# Patient Record
Sex: Female | Born: 1985 | Race: Black or African American | Hispanic: No | Smoking: Never smoker
Health system: Southern US, Community
[De-identification: ages and names within clinical notes are randomized; demographics above are authoritative.]

## PROBLEM LIST (undated history)

## (undated) DIAGNOSIS — J45909 Unspecified asthma, uncomplicated: Secondary | ICD-10-CM

## (undated) DIAGNOSIS — Z9109 Other allergy status, other than to drugs and biological substances: Secondary | ICD-10-CM

## (undated) DIAGNOSIS — O24419 Gestational diabetes mellitus in pregnancy, unspecified control: Secondary | ICD-10-CM

## (undated) DIAGNOSIS — F419 Anxiety disorder, unspecified: Secondary | ICD-10-CM

## (undated) DIAGNOSIS — F99 Mental disorder, not otherwise specified: Secondary | ICD-10-CM

## (undated) HISTORY — DX: Other allergy status, other than to drugs and biological substances: Z91.09

## (undated) HISTORY — PX: KNEE SURGERY: SHX244

## (undated) HISTORY — PX: TONSILLECTOMY: SUR1361

## (undated) HISTORY — PX: COLONOSCOPY: SHX5424

## (undated) HISTORY — DX: Anxiety disorder, unspecified: F41.9

## (undated) HISTORY — PX: COLON SURGERY: SHX602

## (undated) HISTORY — DX: Gestational diabetes mellitus in pregnancy, unspecified control: O24.419

## (undated) HISTORY — DX: Mental disorder, not otherwise specified: F99

---

## 2011-12-22 ENCOUNTER — Encounter (HOSPITAL_COMMUNITY): Payer: Self-pay | Admitting: Emergency Medicine

## 2011-12-22 ENCOUNTER — Emergency Department (HOSPITAL_COMMUNITY)
Admission: EM | Admit: 2011-12-22 | Discharge: 2011-12-22 | Disposition: A | Discharge status: Home / Self Care | Payer: Medicaid - Out of State | Attending: Emergency Medicine | Admitting: Emergency Medicine

## 2011-12-22 DIAGNOSIS — R102 Pelvic and perineal pain: Secondary | ICD-10-CM

## 2011-12-22 DIAGNOSIS — R319 Hematuria, unspecified: Secondary | ICD-10-CM | POA: Insufficient documentation

## 2011-12-22 DIAGNOSIS — N760 Acute vaginitis: Secondary | ICD-10-CM | POA: Insufficient documentation

## 2011-12-22 DIAGNOSIS — R109 Unspecified abdominal pain: Secondary | ICD-10-CM | POA: Insufficient documentation

## 2011-12-22 DIAGNOSIS — R300 Dysuria: Secondary | ICD-10-CM | POA: Insufficient documentation

## 2011-12-22 DIAGNOSIS — N390 Urinary tract infection, site not specified: Secondary | ICD-10-CM | POA: Insufficient documentation

## 2011-12-22 DIAGNOSIS — N898 Other specified noninflammatory disorders of vagina: Secondary | ICD-10-CM | POA: Insufficient documentation

## 2011-12-22 DIAGNOSIS — J45909 Unspecified asthma, uncomplicated: Secondary | ICD-10-CM | POA: Insufficient documentation

## 2011-12-22 DIAGNOSIS — A499 Bacterial infection, unspecified: Secondary | ICD-10-CM | POA: Insufficient documentation

## 2011-12-22 DIAGNOSIS — M545 Low back pain, unspecified: Secondary | ICD-10-CM | POA: Insufficient documentation

## 2011-12-22 DIAGNOSIS — B9689 Other specified bacterial agents as the cause of diseases classified elsewhere: Secondary | ICD-10-CM | POA: Insufficient documentation

## 2011-12-22 DIAGNOSIS — N949 Unspecified condition associated with female genital organs and menstrual cycle: Secondary | ICD-10-CM | POA: Insufficient documentation

## 2011-12-22 DIAGNOSIS — Z3202 Encounter for pregnancy test, result negative: Secondary | ICD-10-CM | POA: Insufficient documentation

## 2011-12-22 HISTORY — DX: Unspecified asthma, uncomplicated: J45.909

## 2011-12-22 LAB — URINE MICROSCOPIC-ADD ON

## 2011-12-22 LAB — URINALYSIS, ROUTINE W REFLEX MICROSCOPIC
Nitrite: POSITIVE — AB
Specific Gravity, Urine: 1.027 (ref 1.005–1.030)
Urobilinogen, UA: 4 mg/dL — ABNORMAL HIGH (ref 0.0–1.0)
pH: 5 (ref 5.0–8.0)

## 2011-12-22 LAB — POCT PREGNANCY, URINE: Preg Test, Ur: NEGATIVE

## 2011-12-22 LAB — WET PREP, GENITAL: Yeast Wet Prep HPF POC: NONE SEEN

## 2011-12-22 MED ORDER — HYDROCODONE-ACETAMINOPHEN 5-325 MG PO TABS: 1.0000 | ORAL_TABLET | ORAL | Status: DC | PRN

## 2011-12-22 MED ORDER — METRONIDAZOLE 500 MG PO TABS: 500.0000 mg | ORAL_TABLET | Freq: Two times a day (BID) | ORAL | Status: DC

## 2011-12-22 MED ORDER — CEFTRIAXONE SODIUM 250 MG IJ SOLR
250.0000 mg | Freq: Once | INTRAMUSCULAR | Status: AC
  Administered 2011-12-22: 250 mg via INTRAMUSCULAR
  Filled 2011-12-22: qty 250

## 2011-12-22 MED ORDER — ONDANSETRON HCL 4 MG PO TABS
4.0000 mg | ORAL_TABLET | Freq: Once | ORAL | Status: AC
  Administered 2011-12-22: 4 mg via ORAL
  Filled 2011-12-22: qty 1

## 2011-12-22 MED ORDER — OXYCODONE-ACETAMINOPHEN 5-325 MG PO TABS
1.0000 | ORAL_TABLET | Freq: Once | ORAL | Status: AC
  Administered 2011-12-22: 1 via ORAL
  Filled 2011-12-22: qty 1

## 2011-12-22 MED ORDER — PHENAZOPYRIDINE HCL 200 MG PO TABS: 200.0000 mg | ORAL_TABLET | Freq: Three times a day (TID) | ORAL | Status: DC

## 2011-12-22 MED ORDER — SULFAMETHOXAZOLE-TRIMETHOPRIM 800-160 MG PO TABS: 1.0000 | ORAL_TABLET | Freq: Two times a day (BID) | ORAL | Status: DC

## 2011-12-22 MED ORDER — AZITHROMYCIN 250 MG PO TABS
1000.0000 mg | ORAL_TABLET | Freq: Once | ORAL | Status: AC
  Administered 2011-12-22: 1000 mg via ORAL
  Filled 2011-12-22: qty 4

## 2011-12-22 NOTE — ED Provider Notes (Signed)
History     CSN: [id number]  Arrival date & time 12/22/11  0440   First MD Initiated Contact with Patient 12/22/11 [phone]      Chief Complaint  Patient presents with  . Urinary Tract Infection    (Consider location/radiation/quality/duration/timing/severity/associated sxs/prior treatment) Patient is a 26 y.o. female presenting with urinary tract infection. The history is provided by the patient.  Urinary Tract Infection This is a new problem. The current episode started in the past 7 days. The problem occurs constantly. Associated symptoms include abdominal pain. Pertinent negatives include no chills, fever, nausea, rash or vomiting. Associated symptoms comments: She complains of painful urination for the past 5 days, now associated with low abdominal pain, worse on the left, low back pain and vaginal discharge that is bloody. She reports a history of ovarian cyst on the left recently diagnosed in [place] prior to recent move to [facility].Marland Kitchen    Past Medical History  Diagnosis Date  . Asthma     History reviewed. No pertinent past surgical history.  No family history on file.  History  Substance Use Topics  . Smoking status: Never Smoker   . Smokeless tobacco: Not on file  . Alcohol Use: No    OB History    Grav Para Term Preterm Abortions TAB SAB Ect Mult Living                  Review of Systems  Constitutional: Negative for fever and chills.  Gastrointestinal: Positive for abdominal pain. Negative for nausea and vomiting.  Genitourinary: Positive for dysuria, hematuria, vaginal bleeding, vaginal discharge and pelvic pain.  Musculoskeletal: Positive for back pain.  Skin: Negative.  Negative for rash.  Neurological: Negative.     Allergies  Amoxicillin  Home Medications  No current outpatient prescriptions on file.  BP 137/87  Pulse 105  Temp 98.7 F (37.1 C) (Oral)  Resp 21  Wt 145 lb (65.772 kg)  SpO2 100%  LMP 12/18/2011  Physical Exam  Constitutional: She  is oriented to person, place, and time. She appears well-developed and well-nourished.  HENT:  Head: Normocephalic.  Neck: Normal range of motion. Neck supple.  Cardiovascular: Normal rate and regular rhythm.   Pulmonary/Chest: Effort normal and breath sounds normal.  Abdominal: Soft. Bowel sounds are normal. There is tenderness. There is no rebound and no guarding.       Tender to lower abdomen bilaterally. Soft, without mass.   Genitourinary: Vaginal discharge found.       Left adnexal tenderness without palpable mass. No CMT.  Musculoskeletal: Normal range of motion.  Neurological: She is alert and oriented to person, place, and time.  Skin: Skin is warm and dry. No rash noted.  Psychiatric: She has a normal mood and affect.    ED Course  Procedures (including critical care time)  Labs Reviewed  URINALYSIS, ROUTINE W REFLEX MICROSCOPIC - Abnormal; Notable for the following:    Color, Urine RED (*)  BIOCHEMICALS MAY BE AFFECTED BY COLOR   APPearance CLOUDY (*)     Hgb urine dipstick SMALL (*)     Bilirubin Urine SMALL (*)     Ketones, ur 15 (*)     Protein, ur 30 (*)     Urobilinogen, UA 4.0 (*)     Nitrite POSITIVE (*)     Leukocytes, UA MODERATE (*)     All other components within normal limits  URINE MICROSCOPIC-ADD ON - Abnormal; Notable for the following:    Bacteria,  UA MANY (*)     All other components within normal limits  POCT PREGNANCY, URINE   No results found.   No diagnosis found. 1. uti 2. Pelvic pain    MDM  Patient care transferred to [facility] with wet prep/GC pending. Abx given. Plan - tx for UTI (Bactrim, Pyridium, Norco) and GYN follow up for history of left ovarian cyst and persistent discomfort.         Rodena Medin, PA-C 12/22/11 [phone]

## 2011-12-22 NOTE — ED Notes (Signed)
Pt alert, arrives from home, c/o lower abd pain, bloody discharge, resp even unlabored, skin pwd, c/o nausea

## 2011-12-22 NOTE — ED Provider Notes (Signed)
Received signout on this patient from Langley Adie, PA-C.  Plan is to discharge the patient pending wet mount results.  Wet mount shows BV.  Will give metronidazole and discharge the patient.  Roxy Horseman, PA-C 12/22/11 [phone]

## 2011-12-22 NOTE — ED Provider Notes (Signed)
Medical screening examination/treatment/procedure(s) were performed by non-physician practitioner and as supervising physician I was immediately available for consultation/collaboration.  Catrena Vari T Cherree Conerly, MD 12/22/11 0719 

## 2011-12-22 NOTE — ED Provider Notes (Signed)
  Medical screening examination/treatment/procedure(s) were performed by non-physician practitioner and as supervising physician I was immediately available for consultation/collaboration.    Vida Roller, MD 12/22/11 [phone]

## 2011-12-22 NOTE — ED Notes (Signed)
[id number]> Expected date:<BR> Expected time:<BR> Means of arrival:<BR> Comments:<BR> Because topher is tired

## 2011-12-22 NOTE — ED Notes (Signed)
Pelvic cart at pt bedside 

## 2011-12-24 LAB — GC/CHLAMYDIA PROBE AMP
CT Probe RNA: NEGATIVE
GC Probe RNA: POSITIVE — AB

## 2011-12-25 NOTE — ED Notes (Signed)
+  Gonorrhea Patient treated with rocephin and zithromax- [facility]

## 2014-02-14 ENCOUNTER — Encounter (HOSPITAL_COMMUNITY): Payer: Self-pay | Admitting: Emergency Medicine

## 2014-02-14 ENCOUNTER — Emergency Department (HOSPITAL_COMMUNITY)
Admission: EM | Admit: 2014-02-14 | Discharge: 2014-02-14 | Disposition: A | Discharge status: Home / Self Care | Payer: Medicaid - Out of State | Attending: Emergency Medicine | Admitting: Emergency Medicine

## 2014-02-14 DIAGNOSIS — J069 Acute upper respiratory infection, unspecified: Secondary | ICD-10-CM

## 2014-02-14 DIAGNOSIS — Z3202 Encounter for pregnancy test, result negative: Secondary | ICD-10-CM | POA: Insufficient documentation

## 2014-02-14 DIAGNOSIS — N760 Acute vaginitis: Secondary | ICD-10-CM | POA: Insufficient documentation

## 2014-02-14 DIAGNOSIS — Z880 Allergy status to penicillin: Secondary | ICD-10-CM | POA: Insufficient documentation

## 2014-02-14 DIAGNOSIS — J45909 Unspecified asthma, uncomplicated: Secondary | ICD-10-CM | POA: Insufficient documentation

## 2014-02-14 DIAGNOSIS — B9689 Other specified bacterial agents as the cause of diseases classified elsewhere: Secondary | ICD-10-CM

## 2014-02-14 DIAGNOSIS — Z79899 Other long term (current) drug therapy: Secondary | ICD-10-CM | POA: Insufficient documentation

## 2014-02-14 LAB — WET PREP, GENITAL
Trich, Wet Prep: NONE SEEN
WBC WET PREP: NONE SEEN
YEAST WET PREP: NONE SEEN

## 2014-02-14 LAB — CBC WITH DIFFERENTIAL/PLATELET
Basophils Absolute: 0 10*3/uL (ref 0.0–0.1)
Basophils Relative: 0 % (ref 0–1)
EOS ABS: 0.1 10*3/uL (ref 0.0–0.7)
EOS PCT: 2 % (ref 0–5)
HCT: 42 % (ref 36.0–46.0)
HEMOGLOBIN: 14.3 g/dL (ref 12.0–15.0)
LYMPHS ABS: 2.5 10*3/uL (ref 0.7–4.0)
Lymphocytes Relative: 39 % (ref 12–46)
MCH: 32.6 pg (ref 26.0–34.0)
MCHC: 34 g/dL (ref 30.0–36.0)
MCV: 95.7 fL (ref 78.0–100.0)
Monocytes Absolute: 0.5 10*3/uL (ref 0.1–1.0)
Monocytes Relative: 8 % (ref 3–12)
Neutro Abs: 3.2 10*3/uL (ref 1.7–7.7)
Neutrophils Relative %: 51 % (ref 43–77)
PLATELETS: 237 10*3/uL (ref 150–400)
RBC: 4.39 MIL/uL (ref 3.87–5.11)
RDW: 13.2 % (ref 11.5–15.5)
WBC: 6.3 10*3/uL (ref 4.0–10.5)

## 2014-02-14 LAB — URINALYSIS, ROUTINE W REFLEX MICROSCOPIC
BILIRUBIN URINE: NEGATIVE
GLUCOSE, UA: NEGATIVE mg/dL
HGB URINE DIPSTICK: NEGATIVE
KETONES UR: NEGATIVE mg/dL
Leukocytes, UA: NEGATIVE
NITRITE: NEGATIVE
PROTEIN: NEGATIVE mg/dL
Specific Gravity, Urine: 1.03 (ref 1.005–1.030)
UROBILINOGEN UA: 1 mg/dL (ref 0.0–1.0)
pH: 6 (ref 5.0–8.0)

## 2014-02-14 LAB — BASIC METABOLIC PANEL
ANION GAP: 9 (ref 5–15)
BUN: 16 mg/dL (ref 6–23)
CO2: 22 mmol/L (ref 19–32)
CREATININE: 0.88 mg/dL (ref 0.50–1.10)
Calcium: 9 mg/dL (ref 8.4–10.5)
Chloride: 108 mmol/L (ref 96–112)
GFR, EST NON AFRICAN AMERICAN: 90 mL/min — AB (ref >90)
Glucose, Bld: 94 mg/dL (ref 70–99)
POTASSIUM: 4.3 mmol/L (ref 3.5–5.1)
Sodium: 139 mmol/L (ref 135–145)

## 2014-02-14 LAB — POC URINE PREG, ED: PREG TEST UR: NEGATIVE

## 2014-02-14 MED ORDER — GUAIFENESIN-DM 100-10 MG/5ML PO SYRP: 5.0000 mL | ORAL_SOLUTION | ORAL | Status: DC | PRN

## 2014-02-14 MED ORDER — METRONIDAZOLE 500 MG PO TABS: 500.0000 mg | ORAL_TABLET | Freq: Two times a day (BID) | ORAL | Status: DC

## 2014-02-14 MED ORDER — BENZONATATE 100 MG PO CAPS: 100.0000 mg | ORAL_CAPSULE | Freq: Three times a day (TID) | ORAL | Status: DC

## 2014-02-14 NOTE — ED Notes (Signed)
Per pt, states cold symptoms for a couple of days-vaginal discharge/UTI symptoms for "awhile"

## 2014-02-14 NOTE — Discharge Instructions (Signed)
DO not drink alcohol with Flagyl.    Bacterial Vaginosis Bacterial vaginosis is a vaginal infection that occurs when the normal balance of bacteria in the vagina is disrupted. It results from an overgrowth of certain bacteria. This is the most common vaginal infection in women of childbearing age. Treatment is important to prevent complications, especially in pregnant women, as it can cause a premature delivery. CAUSES  Bacterial vaginosis is caused by an increase in harmful bacteria that are normally present in smaller amounts in the vagina. Several different kinds of bacteria can cause bacterial vaginosis. However, the reason that the condition develops is not fully understood. RISK FACTORS Certain activities or behaviors can put you at an increased risk of developing bacterial vaginosis, including:  Having a new sex partner or multiple sex partners.  Douching.  Using an intrauterine device (IUD) for contraception. Women do not get bacterial vaginosis from toilet seats, bedding, swimming pools, or contact with objects around them. SIGNS AND SYMPTOMS  Some women with bacterial vaginosis have no signs or symptoms. Common symptoms include:  Grey vaginal discharge.  A fishlike odor with discharge, especially after sexual intercourse.  Itching or burning of the vagina and vulva.  Burning or pain with urination. DIAGNOSIS  Your health care provider will take a medical history and examine the vagina for signs of bacterial vaginosis. A sample of vaginal fluid may be taken. Your health care provider will look at this sample under a microscope to check for bacteria and abnormal cells. A vaginal pH test may also be done.  TREATMENT  Bacterial vaginosis may be treated with antibiotic medicines. These may be given in the form of a pill or a vaginal cream. A second round of antibiotics may be prescribed if the condition comes back after treatment.  HOME CARE INSTRUCTIONS   Only take  over-the-counter or prescription medicines as directed by your health care provider.  If antibiotic medicine was prescribed, take it as directed. Make sure you finish it even if you start to feel better.  Do not have sex until treatment is completed.  Tell all sexual partners that you have a vaginal infection. They should see their health care provider and be treated if they have problems, such as a mild rash or itching.  Practice safe sex by using condoms and only having one sex partner. SEEK MEDICAL CARE IF:   Your symptoms are not improving after 3 days of treatment.  You have increased discharge or pain.  You have a fever. MAKE SURE YOU:   Understand these instructions.  Will watch your condition.  Will get help right away if you are not doing well or get worse. FOR MORE INFORMATION  Centers for Disease Control and Prevention, Division of STD Prevention: SolutionApps.co.za American Sexual Health Association (ASHA): www.ashastd.org  Document Released: 01/01/2005 Document Revised: 10/22/2012 Document Reviewed: 08/13/2012 [facility] Patient Information 2015 [place], [place]. This information is not intended to replace advice given to you by your health care provider. Make sure you discuss any questions you have with your health care provider.  Upper Respiratory Infection, Adult An upper respiratory infection (URI) is also sometimes known as the common cold. The upper respiratory tract includes the nose, sinuses, throat, trachea, and bronchi. Bronchi are the airways leading to the lungs. Most people improve within 1 week, but symptoms can last up to 2 weeks. A residual cough may last even longer.  CAUSES Many different viruses can infect the tissues lining the upper respiratory tract. The tissues become  irritated and inflamed and often become very moist. Mucus production is also common. A cold is contagious. You can easily spread the virus to others by oral contact. This includes kissing,  sharing a glass, coughing, or sneezing. Touching your mouth or nose and then touching a surface, which is then touched by another person, can also spread the virus. SYMPTOMS  Symptoms typically develop 1 to 3 days after you come in contact with a cold virus. Symptoms vary from person to person. They may include:  Runny nose.  Sneezing.  Nasal congestion.  Sinus irritation.  Sore throat.  Loss of voice (laryngitis).  Cough.  Fatigue.  Muscle aches.  Loss of appetite.  Headache.  Low-grade fever. DIAGNOSIS  You might diagnose your own cold based on familiar symptoms, since most people get a cold 2 to 3 times a year. Your caregiver can confirm this based on your exam. Most importantly, your caregiver can check that your symptoms are not due to another disease such as strep throat, sinusitis, pneumonia, asthma, or epiglottitis. Blood tests, throat tests, and X-rays are not necessary to diagnose a common cold, but they may sometimes be helpful in excluding other more serious diseases. Your caregiver will decide if any further tests are required. RISKS AND COMPLICATIONS  You may be at risk for a more severe case of the common cold if you smoke cigarettes, have chronic heart disease (such as heart failure) or lung disease (such as asthma), or if you have a weakened immune system. The very young and very old are also at risk for more serious infections. Bacterial sinusitis, middle ear infections, and bacterial pneumonia can complicate the common cold. The common cold can worsen asthma and chronic obstructive pulmonary disease (COPD). Sometimes, these complications can require emergency medical care and may be life-threatening. PREVENTION  The best way to protect against getting a cold is to practice good hygiene. Avoid oral or hand contact with people with cold symptoms. Wash your hands often if contact occurs. There is no clear evidence that vitamin C, vitamin E, echinacea, or exercise  reduces the chance of developing a cold. However, it is always recommended to get plenty of rest and practice good nutrition. TREATMENT  Treatment is directed at relieving symptoms. There is no cure. Antibiotics are not effective, because the infection is caused by a virus, not by bacteria. Treatment may include:  Increased fluid intake. Sports drinks offer valuable electrolytes, sugars, and fluids.  Breathing heated mist or steam (vaporizer or shower).  Eating chicken soup or other clear broths, and maintaining good nutrition.  Getting plenty of rest.  Using gargles or lozenges for comfort.  Controlling fevers with ibuprofen or acetaminophen as directed by your caregiver.  Increasing usage of your inhaler if you have asthma. Zinc gel and zinc lozenges, taken in the first 24 hours of the common cold, can shorten the duration and lessen the severity of symptoms. Pain medicines may help with fever, muscle aches, and throat pain. A variety of non-prescription medicines are available to treat congestion and runny nose. Your caregiver can make recommendations and may suggest nasal or lung inhalers for other symptoms.  HOME CARE INSTRUCTIONS   Only take over-the-counter or prescription medicines for pain, discomfort, or fever as directed by your caregiver.  Use a warm mist humidifier or inhale steam from a shower to increase air moisture. This may keep secretions moist and make it easier to breathe.  Drink enough water and fluids to keep your urine clear or  pale yellow.  Rest as needed.  Return to work when your temperature has returned to normal or as your caregiver advises. You may need to stay home longer to avoid infecting others. You can also use a face mask and careful hand washing to prevent spread of the virus. SEEK MEDICAL CARE IF:   After the first few days, you feel you are getting worse rather than better.  You need your caregiver's advice about medicines to control  symptoms.  You develop chills, worsening shortness of breath, or brown or red sputum. These may be signs of pneumonia.  You develop yellow or brown nasal discharge or pain in the face, especially when you bend forward. These may be signs of sinusitis.  You develop a fever, swollen neck glands, pain with swallowing, or white areas in the back of your throat. These may be signs of strep throat. SEEK IMMEDIATE MEDICAL CARE IF:   You have a fever.  You develop severe or persistent headache, ear pain, sinus pain, or chest pain.  You develop wheezing, a prolonged cough, cough up blood, or have a change in your usual mucus (if you have chronic lung disease).  You develop sore muscles or a stiff neck. Document Released: 06/27/2000 Document Revised: 03/26/2011 Document Reviewed: 04/08/2013 [facility] Patient Information 2015 [place], [place]. This information is not intended to replace advice given to you by your health care provider. Make sure you discuss any questions you have with your health care provider.

## 2014-02-14 NOTE — ED Provider Notes (Signed)
CSN: [id number]     Arrival date & time 02/14/14  0802 History   First MD Initiated Contact with Patient 02/14/14 [phone]     Chief Complaint  Patient presents with  . URI  . Vaginal Discharge     (Consider location/radiation/quality/duration/timing/severity/associated sxs/prior Treatment) HPI Comments: 29 y/o F with a PMH of asthma presenting to the ED for evaluation of URI symptoms and vaginal discharge. She has had a non-productive cough and runny nose for one week, her son who is here today also being evaluated has had similar symptoms that began around the same time. She denies fever, chills, SOB, nausea, vomiting, diarrhea. She has also had a white vaginal discharge with mild back and pelvic pain for several weeks. She denies vaginal bleeding, hematuria, urinary frequency, dysuria.   Patient is a 29 y.o. female presenting with URI and vaginal discharge. The history is provided by the patient.  URI Vaginal Discharge   Past Medical History  Diagnosis Date  . Asthma    History reviewed. No pertinent past surgical history. No family history on file. History  Substance Use Topics  . Smoking status: Never Smoker   . Smokeless tobacco: Not on file  . Alcohol Use: No   OB History    No data available     Review of Systems  Genitourinary: Positive for vaginal discharge.      Allergies  Amoxicillin  Home Medications   Prior to Admission medications   Medication Sig Start Date End Date Taking? Authorizing Provider  benzonatate (TESSALON) 100 MG capsule Take 1 capsule (100 mg total) by mouth every 8 (eight) hours. 02/14/14   Ermie Glendenning, PA-C  guaiFENesin-dextromethorphan (ROBITUSSIN DM) 100-10 MG/5ML syrup Take 5 mLs by mouth every 4 (four) hours as needed for cough. 02/14/14   Arthor Captain, PA-C  HYDROcodone-acetaminophen (NORCO/VICODIN) 5-325 MG per tablet Take 1 tablet by mouth every 4 (four) hours as needed for pain. Patient not taking: Reported on 02/14/2014 12/22/11    Melvenia Beam A Upstill, PA-C  metroNIDAZOLE (FLAGYL) 500 MG tablet Take 1 tablet (500 mg total) by mouth 2 (two) times daily. One po bid x 7 days 02/14/14   Arthor Captain, PA-C  phenazopyridine (PYRIDIUM) 200 MG tablet Take 1 tablet (200 mg total) by mouth 3 (three) times daily. Patient not taking: Reported on 02/14/2014 12/22/11   Melvenia Beam A Upstill, PA-C  sulfamethoxazole-trimethoprim (SEPTRA DS) 800-160 MG per tablet Take 1 tablet by mouth every 12 (twelve) hours. Patient not taking: Reported on 02/14/2014 12/22/11   Melvenia Beam A Upstill, PA-C   BP 112/73 mmHg  Pulse 73  Temp(Src) 98.2 F (36.8 C) (Oral)  Resp 16  SpO2 100%  LMP 01/31/2014 Physical Exam  Constitutional: She is oriented to person, place, and time. She appears well-developed and well-nourished. No distress.  HENT:  Head: Normocephalic and atraumatic.  Eyes: Conjunctivae are normal. No scleral icterus.  Neck: Normal range of motion.  Cardiovascular: Normal rate, regular rhythm and normal heart sounds.  Exam reveals no gallop and no friction rub.   No murmur heard. Pulmonary/Chest: Effort normal and breath sounds normal. No respiratory distress. She has no wheezes.  Ronchi, clear with cough  Abdominal: Soft. Bowel sounds are normal. She exhibits no distension and no mass. There is no tenderness. There is no guarding.  Genitourinary:  Pelvic exam: normal external genitalia, vulva, vagina, cervix, uterus and adnexa. White creamy vaginal discharge noted. No CMT, Adnexal tenderness or fullness  Neurological: She is alert and oriented to person, place,  and time.  Skin: Skin is warm and dry. She is not diaphoretic.    ED Course  Procedures (including critical care time) Labs Review Labs Reviewed  WET PREP, GENITAL - Abnormal; Notable for the following:    Clue Cells Wet Prep HPF POC FEW (*)    All other components within normal limits  BASIC METABOLIC PANEL - Abnormal; Notable for the following:    GFR calc non Af Amer 90 (*)    All  other components within normal limits  CBC WITH DIFFERENTIAL/PLATELET  URINALYSIS, ROUTINE W REFLEX MICROSCOPIC  HIV ANTIBODY (ROUTINE TESTING)  RPR  POC URINE PREG, ED  GC/CHLAMYDIA PROBE AMP ([place])    Imaging Review No results found.   EKG Interpretation None      MDM   Final diagnoses:  URI (upper respiratory infection)  BV (bacterial vaginosis)    Patient with URI, bacterial vaginosis. Patient will be discharged with symptomatic treatment and Flagyl. She appears safe for discharge. Follow up with PCP.  Arthor CaptainAbigail Forney Kleinpeter, PA-C 02/14/14 1657  Benny LennertJoseph L Zammit, MD 03/02/14 [id number]

## 2014-02-15 LAB — GC/CHLAMYDIA PROBE AMP (~~LOC~~) NOT AT ARMC
Chlamydia: NEGATIVE
NEISSERIA GONORRHEA: NEGATIVE

## 2014-02-16 LAB — RPR: RPR: NONREACTIVE

## 2014-02-16 LAB — HIV ANTIBODY (ROUTINE TESTING W REFLEX): HIV Screen 4th Generation wRfx: NONREACTIVE

## 2014-05-28 ENCOUNTER — Encounter (HOSPITAL_COMMUNITY): Payer: Self-pay | Admitting: Emergency Medicine

## 2014-05-28 ENCOUNTER — Emergency Department (HOSPITAL_COMMUNITY)
Admission: EM | Admit: 2014-05-28 | Discharge: 2014-05-28 | Disposition: A | Discharge status: Home / Self Care | Payer: Medicaid - Out of State | Attending: Emergency Medicine | Admitting: Emergency Medicine

## 2014-05-28 DIAGNOSIS — Z880 Allergy status to penicillin: Secondary | ICD-10-CM | POA: Insufficient documentation

## 2014-05-28 DIAGNOSIS — Y998 Other external cause status: Secondary | ICD-10-CM | POA: Insufficient documentation

## 2014-05-28 DIAGNOSIS — Y92410 Unspecified street and highway as the place of occurrence of the external cause: Secondary | ICD-10-CM | POA: Insufficient documentation

## 2014-05-28 DIAGNOSIS — Y9389 Activity, other specified: Secondary | ICD-10-CM | POA: Insufficient documentation

## 2014-05-28 DIAGNOSIS — J45909 Unspecified asthma, uncomplicated: Secondary | ICD-10-CM | POA: Insufficient documentation

## 2014-05-28 DIAGNOSIS — IMO0001 Reserved for inherently not codable concepts without codable children: Secondary | ICD-10-CM

## 2014-05-28 DIAGNOSIS — S161XXA Strain of muscle, fascia and tendon at neck level, initial encounter: Secondary | ICD-10-CM | POA: Insufficient documentation

## 2014-05-28 MED ORDER — IBUPROFEN 800 MG PO TABS: 800.0000 mg | ORAL_TABLET | Freq: Three times a day (TID) | ORAL | Status: DC

## 2014-05-28 MED ORDER — IBUPROFEN 800 MG PO TABS
800.0000 mg | ORAL_TABLET | Freq: Once | ORAL | Status: AC
  Administered 2014-05-28: 800 mg via ORAL
  Filled 2014-05-28: qty 1

## 2014-05-28 MED ORDER — CYCLOBENZAPRINE HCL 5 MG PO TABS: 5.0000 mg | ORAL_TABLET | Freq: Three times a day (TID) | ORAL | Status: DC | PRN
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
## 2014-05-30 ENCOUNTER — Emergency Department (HOSPITAL_COMMUNITY): Payer: Medicaid - Out of State

## 2014-05-30 ENCOUNTER — Emergency Department (HOSPITAL_COMMUNITY)
Admission: EM | Admit: 2014-05-30 | Discharge: 2014-05-30 | Disposition: A | Discharge status: Home / Self Care | Payer: Medicaid - Out of State | Attending: Emergency Medicine | Admitting: Emergency Medicine

## 2014-05-30 ENCOUNTER — Encounter (HOSPITAL_COMMUNITY): Payer: Self-pay | Admitting: Emergency Medicine

## 2014-05-30 DIAGNOSIS — Z791 Long term (current) use of non-steroidal anti-inflammatories (NSAID): Secondary | ICD-10-CM | POA: Insufficient documentation

## 2014-05-30 DIAGNOSIS — S24109D Unspecified injury at unspecified level of thoracic spinal cord, subsequent encounter: Secondary | ICD-10-CM | POA: Insufficient documentation

## 2014-05-30 DIAGNOSIS — J45909 Unspecified asthma, uncomplicated: Secondary | ICD-10-CM | POA: Insufficient documentation

## 2014-05-30 DIAGNOSIS — Z3202 Encounter for pregnancy test, result negative: Secondary | ICD-10-CM | POA: Insufficient documentation

## 2014-05-30 DIAGNOSIS — M546 Pain in thoracic spine: Secondary | ICD-10-CM

## 2014-05-30 DIAGNOSIS — Z79899 Other long term (current) drug therapy: Secondary | ICD-10-CM | POA: Insufficient documentation

## 2014-05-30 DIAGNOSIS — Z880 Allergy status to penicillin: Secondary | ICD-10-CM | POA: Insufficient documentation

## 2014-05-30 DIAGNOSIS — M7989 Other specified soft tissue disorders: Secondary | ICD-10-CM | POA: Insufficient documentation

## 2014-05-30 LAB — POC URINE PREG, ED: Preg Test, Ur: NEGATIVE
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
## 2015-12-12 ENCOUNTER — Emergency Department (HOSPITAL_COMMUNITY)
Admission: EM | Admit: 2015-12-12 | Discharge: 2015-12-12 | Disposition: A | Discharge status: Home / Self Care | Payer: Medicaid - Out of State | Attending: Emergency Medicine | Admitting: Emergency Medicine

## 2015-12-12 ENCOUNTER — Encounter (HOSPITAL_COMMUNITY): Payer: Self-pay | Admitting: Emergency Medicine

## 2015-12-12 DIAGNOSIS — L509 Urticaria, unspecified: Secondary | ICD-10-CM | POA: Insufficient documentation

## 2015-12-12 DIAGNOSIS — Z79899 Other long term (current) drug therapy: Secondary | ICD-10-CM | POA: Insufficient documentation

## 2015-12-12 DIAGNOSIS — J45909 Unspecified asthma, uncomplicated: Secondary | ICD-10-CM | POA: Insufficient documentation

## 2015-12-12 MED ORDER — PREDNISONE 20 MG PO TABS: 40.0000 mg | ORAL_TABLET | Freq: Every day | ORAL | 0 refills | Status: DC

## 2015-12-12 MED ORDER — PREDNISONE 20 MG PO TABS
60.0000 mg | ORAL_TABLET | Freq: Once | ORAL | Status: AC
  Administered 2015-12-12: 60 mg via ORAL
  Filled 2015-12-12: qty 3

## 2015-12-12 NOTE — ED Provider Notes (Signed)
[facility]-EMERGENCY DEPT Provider Note   CSN: [id number] Arrival date & time: 12/12/15  0005     History   Chief Complaint Chief Complaint  Patient presents with  . Rash    HPI Caitlin Terrell is a 30 y.o. female.  Patient presents emergency department with chief complaint of hives. She states that she has had a rash on her arms and legs and on her trunk which has been gradually worsening over the past day or so. She denies any new contacts or exposures to any new soaps or detergents. Denies eating or drinking anything new. She is uncertain what has set her off. She does have a history of asthma. She denies any associated shortness of breath, throat swelling, nausea, or vomiting, or diarrhea. She is tried taking Benadryl with some relief.   The history is provided by the patient. No language interpreter was used.    Past Medical History:  Diagnosis Date  . Asthma     There are no active problems to display for this patient.   Past Surgical History:  Procedure Laterality Date  . COLONOSCOPY      OB History    No data available       Home Medications    Prior to Admission medications   Medication Sig Start Date End Date Taking? Authorizing Provider  benzonatate (TESSALON) 100 MG capsule Take 1 capsule (100 mg total) by mouth every 8 (eight) hours. Patient not taking: Reported on 05/30/2014 02/14/14   Arthor CaptainAbigail Harris, PA-C  cyclobenzaprine (FLEXERIL) 5 MG tablet Take 1 tablet (5 mg total) by mouth 3 (three) times daily as needed for muscle spasms. 05/28/14   Teressa LowerVrinda Pickering, NP  guaiFENesin-dextromethorphan (ROBITUSSIN DM) 100-10 MG/5ML syrup Take 5 mLs by mouth every 4 (four) hours as needed for cough. Patient not taking: Reported on 05/30/2014 02/14/14   Arthor CaptainAbigail Harris, PA-C  HYDROcodone-acetaminophen (NORCO/VICODIN) 5-325 MG per tablet Take 1 tablet by mouth every 4 (four) hours as needed for pain. Patient not taking: Reported on 02/14/2014 12/22/11   Elpidio AnisShari Upstill, PA-C    ibuprofen (ADVIL,MOTRIN) 800 MG tablet Take 1 tablet (800 mg total) by mouth 3 (three) times daily. 05/28/14   Teressa LowerVrinda Pickering, NP  metroNIDAZOLE (FLAGYL) 500 MG tablet Take 1 tablet (500 mg total) by mouth 2 (two) times daily. One po bid x 7 days Patient not taking: Reported on 05/30/2014 02/14/14   Arthor CaptainAbigail Harris, PA-C  phenazopyridine (PYRIDIUM) 200 MG tablet Take 1 tablet (200 mg total) by mouth 3 (three) times daily. Patient not taking: Reported on 02/14/2014 12/22/11   Elpidio AnisShari Upstill, PA-C  predniSONE (DELTASONE) 20 MG tablet Take 2 tablets (40 mg total) by mouth daily. 12/12/15   Roxy Horsemanobert Londa Mackowski, PA-C  sulfamethoxazole-trimethoprim (SEPTRA DS) 800-160 MG per tablet Take 1 tablet by mouth every 12 (twelve) hours. Patient not taking: Reported on 02/14/2014 12/22/11   Elpidio AnisShari Upstill, PA-C    Family History No family history on file.  Social History Social History  Substance Use Topics  . Smoking status: Never Smoker  . Smokeless tobacco: Not on file  . Alcohol use No     Allergies   Amoxicillin and Other   Review of Systems Review of Systems  All other systems reviewed and are negative.    Physical Exam Updated Vital Signs BP 127/91 (BP Location: Right Arm)   Pulse 86   Temp 98.7 F (37.1 C) (Oral)   Resp 15   Ht 5\' 2"  (1.575 m)   Wt 74.8 kg  SpO2 100%   BMI 30.18 kg/m   Physical Exam  Constitutional: She is oriented to person, place, and time. She appears well-developed and well-nourished.  HENT:  Head: Normocephalic and atraumatic.  No throat swelling, no stridor, normal phonation  Eyes: Conjunctivae and EOM are normal. Pupils are equal, round, and reactive to light.  Neck: Normal range of motion. Neck supple.  Cardiovascular: Normal rate and regular rhythm.  Exam reveals no gallop and no friction rub.   No murmur heard. Pulmonary/Chest: Effort normal and breath sounds normal. No respiratory distress. She has no wheezes. She has no rales. She exhibits no  tenderness.  Clear to auscultation bilaterally  Abdominal: Soft. Bowel sounds are normal. She exhibits no distension and no mass. There is no tenderness. There is no rebound and no guarding.  Musculoskeletal: Normal range of motion. She exhibits no edema or tenderness.  Neurological: She is alert and oriented to person, place, and time.  Skin: Skin is warm and dry.  Scattered hives on upper and lower extremities and trunk  Psychiatric: She has a normal mood and affect. Her behavior is normal. Judgment and thought content normal.  Nursing note and vitals reviewed.    ED Treatments / Results  Labs (all labs ordered are listed, but only abnormal results are displayed) Labs Reviewed - No data to display  EKG  EKG Interpretation None       Radiology No results found.  Procedures Procedures (including critical care time)  Medications Ordered in ED Medications  predniSONE (DELTASONE) tablet 60 mg (not administered)     Initial Impression / Assessment and Plan / ED Course  I have reviewed the triage vital signs and the nursing notes.  Pertinent labs & imaging results that were available during my care of the patient were reviewed by me and considered in my medical decision making (see chart for details).  Clinical Course     Patient with hives with unknown exposure. No improvement at home. Will give prednisone. Recommend care follow-up. Final Clinical Impressions(s) / ED Diagnoses   Final diagnoses:  Urticaria    New Prescriptions New Prescriptions   PREDNISONE (DELTASONE) 20 MG TABLET    Take 2 tablets (40 mg total) by mouth daily.     Roxy Horsemanobert Leanord Thibeau, PA-C 12/12/15 0047    Tilden FossaElizabeth Rees, MD 12/12/15 [phone]

## 2015-12-12 NOTE — ED Triage Notes (Signed)
Pt from home with complaints of rash on abdomen and neck that began tonight. Pt has taken PO benadryl and used a benadryl cream around 2200

## 2016-10-19 ENCOUNTER — Emergency Department (HOSPITAL_COMMUNITY)
Admission: EM | Admit: 2016-10-19 | Discharge: 2016-10-19 | Disposition: A | Discharge status: Home / Self Care | Payer: Medicaid - Out of State | Attending: Emergency Medicine | Admitting: Emergency Medicine

## 2016-10-19 ENCOUNTER — Encounter (HOSPITAL_COMMUNITY): Payer: Self-pay | Admitting: *Deleted

## 2016-10-19 DIAGNOSIS — B360 Pityriasis versicolor: Secondary | ICD-10-CM

## 2016-10-19 DIAGNOSIS — Z791 Long term (current) use of non-steroidal anti-inflammatories (NSAID): Secondary | ICD-10-CM | POA: Insufficient documentation

## 2016-10-19 DIAGNOSIS — J45909 Unspecified asthma, uncomplicated: Secondary | ICD-10-CM | POA: Insufficient documentation

## 2016-10-19 MED ORDER — ITRACONAZOLE 100 MG PO CAPS: 200.0000 mg | ORAL_CAPSULE | Freq: Every day | ORAL | 0 refills | Status: DC

## 2016-10-19 NOTE — ED Provider Notes (Signed)
[facility]-EMERGENCY DEPT Provider Note   CSN: [id number] Arrival date & time: 10/19/16  0920     History   Chief Complaint Chief Complaint  Patient presents with  . Rash    HPI Caitlin Terrell is a 31 y.o. female.  31 year old female presents with recurrent facial rash times several months. Notes slight pruritus but denies any associated fever. Rash has slightly raised border and has been nonvesicular. Has not been noted on the rest of her body. Has used over-the-counter antifungals without relief.      Past Medical History:  Diagnosis Date  . Asthma     There are no active problems to display for this patient.   Past Surgical History:  Procedure Laterality Date  . COLONOSCOPY      OB History    No data available       Home Medications    Prior to Admission medications   Medication Sig Start Date End Date Taking? Authorizing Provider  naproxen sodium (ANAPROX) 220 MG tablet Take 660 mg by mouth 2 (two) times daily with a meal.   Yes [provider]  itraconazole (SPORANOX) 100 MG capsule Take 2 capsules (200 mg total) by mouth daily. 2 by mouth daily 7 days 10/19/16   Lorre Nick, MD    Family History No family history on file.  Social History Social History  Substance Use Topics  . Smoking status: Never Smoker  . Smokeless tobacco: Never Used  . Alcohol use No     Allergies   Amoxicillin and Other   Review of Systems Review of Systems  All other systems reviewed and are negative.    Physical Exam Updated Vital Signs BP (!) 124/94 (BP Location: Left Arm)   Pulse 97   Temp 98.3 F (36.8 C) (Oral)   Resp 18   LMP 10/19/2016   SpO2 100%   Physical Exam  Constitutional: She is oriented to person, place, and time. She appears well-developed and well-nourished.  Non-toxic appearance. No distress.  HENT:  Head: Normocephalic and atraumatic.    Eyes: Pupils are equal, round, and reactive to light. Conjunctivae, EOM and lids are  normal.  Neck: Normal range of motion. Neck supple. No tracheal deviation present. No thyroid mass present.  Cardiovascular: Normal rate, regular rhythm and normal heart sounds.  Exam reveals no gallop.   No murmur heard. Pulmonary/Chest: Effort normal and breath sounds normal. No stridor. No respiratory distress. She has no decreased breath sounds. She has no wheezes. She has no rhonchi. She has no rales.  Abdominal: Soft. Normal appearance and bowel sounds are normal. She exhibits no distension. There is no tenderness. There is no rebound and no CVA tenderness.  Musculoskeletal: Normal range of motion. She exhibits no edema or tenderness.  Neurological: She is alert and oriented to person, place, and time. She has normal strength. No cranial nerve deficit or sensory deficit. GCS eye subscore is 4. GCS verbal subscore is 5. GCS motor subscore is 6.  Skin: Skin is warm and dry. Rash noted. No abrasion noted. Rash is macular. Rash is not vesicular.  Psychiatric: She has a normal mood and affect. Her speech is normal and behavior is normal.  Nursing note and vitals reviewed.    ED Treatments / Results  Labs (all labs ordered are listed, but only abnormal results are displayed) Labs Reviewed - No data to display  EKG  EKG Interpretation None       Radiology No results found.  Procedures Procedures (  including critical care time)  Medications Ordered in ED Medications - No data to display   Initial Impression / Assessment and Plan / ED Course  I have reviewed the triage vital signs and the nursing notes.  Pertinent labs & imaging results that were available during my care of the patient were reviewed by me and considered in my medical decision making (see chart for details).     Patient placed on Sporanox and willfollow-up with a dermatologist  Final Clinical Impressions(s) / ED Diagnoses   Final diagnoses:  Pityriasis versicolor    New Prescriptions New Prescriptions     ITRACONAZOLE (SPORANOX) 100 MG CAPSULE    Take 2 capsules (200 mg total) by mouth daily. 2 by mouth daily 7 days     Lorre Nick, MD 10/19/16 1023

## 2016-10-19 NOTE — ED Notes (Signed)
Bed: WTR6 Expected date:  Expected time:  Means of arrival:  Comments: 

## 2016-10-19 NOTE — ED Triage Notes (Signed)
Pt states her face has broken out over the past month. Pt states she began using new make up 2 months ago. Pt states she does not always wash her face properly to remove make up.

## 2017-05-05 ENCOUNTER — Emergency Department (HOSPITAL_COMMUNITY)
Admission: EM | Admit: 2017-05-05 | Discharge: 2017-05-05 | Disposition: A | Discharge status: Home / Self Care | Payer: Medicaid - Out of State | Attending: Emergency Medicine | Admitting: Emergency Medicine

## 2017-05-05 ENCOUNTER — Other Ambulatory Visit: Payer: Self-pay

## 2017-05-05 ENCOUNTER — Encounter (HOSPITAL_COMMUNITY): Payer: Self-pay | Admitting: Emergency Medicine

## 2017-05-05 DIAGNOSIS — Z79899 Other long term (current) drug therapy: Secondary | ICD-10-CM | POA: Insufficient documentation

## 2017-05-05 DIAGNOSIS — Y92511 Restaurant or cafe as the place of occurrence of the external cause: Secondary | ICD-10-CM | POA: Insufficient documentation

## 2017-05-05 DIAGNOSIS — J45909 Unspecified asthma, uncomplicated: Secondary | ICD-10-CM | POA: Insufficient documentation

## 2017-05-05 DIAGNOSIS — F17290 Nicotine dependence, other tobacco product, uncomplicated: Secondary | ICD-10-CM | POA: Insufficient documentation

## 2017-05-05 DIAGNOSIS — W19XXXA Unspecified fall, initial encounter: Secondary | ICD-10-CM

## 2017-05-05 DIAGNOSIS — S29012A Strain of muscle and tendon of back wall of thorax, initial encounter: Secondary | ICD-10-CM | POA: Insufficient documentation

## 2017-05-05 DIAGNOSIS — W010XXA Fall on same level from slipping, tripping and stumbling without subsequent striking against object, initial encounter: Secondary | ICD-10-CM | POA: Insufficient documentation

## 2017-05-05 DIAGNOSIS — Y9389 Activity, other specified: Secondary | ICD-10-CM | POA: Insufficient documentation

## 2017-05-05 DIAGNOSIS — Y999 Unspecified external cause status: Secondary | ICD-10-CM | POA: Insufficient documentation

## 2017-05-05 DIAGNOSIS — T148XXA Other injury of unspecified body region, initial encounter: Secondary | ICD-10-CM

## 2017-05-05 MED ORDER — IBUPROFEN 600 MG PO TABS: 600.0000 mg | ORAL_TABLET | Freq: Four times a day (QID) | ORAL | 0 refills | Status: DC | PRN

## 2017-05-05 MED ORDER — CYCLOBENZAPRINE HCL 10 MG PO TABS: 10.0000 mg | ORAL_TABLET | Freq: Two times a day (BID) | ORAL | 0 refills | Status: DC | PRN

## 2017-05-05 NOTE — ED Provider Notes (Signed)
[facility] Note   CSN: [id number] Arrival date & time: 05/05/17  4098     History   Chief Complaint Chief Complaint  Patient presents with  . Back Pain    HPI Caitlin Terrell is a 32 y.o. female.  Patient presents to the emergency department with chief complaint of mechanical fall.  She states that she slipped and fell at [facility].  States that she hit her back on the floor.  She reports that she went home and went to work, but her muscles began to stiffen and now she complains of worsening pain in her upper back.  She denies any difficulty with ambulation.  She denies any head injury.  Denies numbness, weakness, or tingling.  She has not taken anything for her symptoms.  The history is provided by the patient. No language interpreter was used.    Past Medical History:  Diagnosis Date  . Asthma     There are no active problems to display for this patient.   Past Surgical History:  Procedure Laterality Date  . COLONOSCOPY       OB History   None      Home Medications    Prior to Admission medications   Medication Sig Start Date End Date Taking? Authorizing Provider  itraconazole (SPORANOX) 100 MG capsule Take 2 capsules (200 mg total) by mouth daily. 2 by mouth daily 7 days 10/19/16   Lorre Nick, MD  naproxen sodium (ANAPROX) 220 MG tablet Take 660 mg by mouth 2 (two) times daily with a meal.    [provider]    Family History History reviewed. No pertinent family history.  Social History Social History   Tobacco Use  . Smoking status: Current Some Day Smoker    Types: Cigars  . Smokeless tobacco: Never Used  Substance Use Topics  . Alcohol use: No  . Drug use: No     Allergies   Amoxicillin and Other   Review of Systems Review of Systems  All other systems reviewed and are negative.    Physical Exam Updated Vital Signs BP 138/90 (BP Location: Right Arm)   Pulse (!) 111   Temp  98.4 F (36.9 C) (Oral)   Resp 16   Ht 5\' 2"  (1.575 m)   Wt 75.3 kg (166 lb)   LMP 05/04/2017   SpO2 100%   BMI 30.36 kg/m   Physical Exam  Constitutional: She is oriented to person, place, and time. No distress.  HENT:  Head: Normocephalic and atraumatic.  Eyes: Pupils are equal, round, and reactive to light. Conjunctivae and EOM are normal.  Neck: No tracheal deviation present.  Cardiovascular: Normal rate.  Pulmonary/Chest: Effort normal. No respiratory distress.  Abdominal: Soft.  Musculoskeletal: Normal range of motion.  No CTLS spine tenderness, bilateral upper trapezius muscles TTP, no bony abnormality or deformities  Neurological: She is alert and oriented to person, place, and time.  Skin: Skin is warm and dry. She is not diaphoretic.  No contusions or skin abnormality  Psychiatric: Judgment normal.  Nursing note and vitals reviewed.    ED Treatments / Results  Labs (all labs ordered are listed, but only abnormal results are displayed) Labs Reviewed - No data to display  EKG None  Radiology No results found.  Procedures Procedures (including critical care time)  Medications Ordered in ED Medications - No data to display   Initial Impression / Assessment and Plan / ED Course  I have reviewed  the triage vital signs and the nursing notes.  Pertinent labs & imaging results that were available during my care of the patient were reviewed by me and considered in my medical decision making (see chart for details).    Patient with muscle soreness to her upper back after a mechanical fall tonight.  No other injuries.  No crepitus or bony step-off or deformity.  Doubt fracture.  Likely muscle strain.  Will treat with muscle relaxer and NSAIDs.  Final Clinical Impressions(s) / ED Diagnoses   Final diagnoses:  Muscle strain  Fall, initial encounter    ED Discharge Orders        Ordered    cyclobenzaprine (FLEXERIL) 10 MG tablet  2 times daily PRN      05/05/17 0540    ibuprofen (ADVIL,MOTRIN) 600 MG tablet  Every 6 hours PRN     05/05/17 0540       Roxy HorsemanBrowning, Monish Haliburton, PA-C 05/05/17 0541    Ward, Layla MawKristen N, DO 05/05/17 0602

## 2017-05-05 NOTE — ED Notes (Signed)
EDPA into room, prior to RN assessment, see PA notes, orders received to d/c. Care assumed at time of d/c.    Alert, NAD, calm, interactive, resps e/u, speaking in clear complete sentences, no dyspnea noted, skin W&D, VSS, c/o R upper back pain, (denies: numbness, tingling, sob, NV, dizziness or visual changes).

## 2017-05-05 NOTE — ED Triage Notes (Signed)
[facility] and [facility] back and now is having worsening pain in back and shoulders. Pt denies any head injury.

## 2017-08-03 ENCOUNTER — Emergency Department (HOSPITAL_COMMUNITY)
Admission: EM | Admit: 2017-08-03 | Discharge: 2017-08-03 | Disposition: A | Discharge status: Home / Self Care | Payer: Medicaid - Out of State | Attending: Emergency Medicine | Admitting: Emergency Medicine

## 2017-08-03 ENCOUNTER — Other Ambulatory Visit: Payer: Self-pay

## 2017-08-03 ENCOUNTER — Encounter (HOSPITAL_COMMUNITY): Payer: Self-pay | Admitting: Emergency Medicine

## 2017-08-03 DIAGNOSIS — H9201 Otalgia, right ear: Secondary | ICD-10-CM

## 2017-08-03 DIAGNOSIS — J45909 Unspecified asthma, uncomplicated: Secondary | ICD-10-CM | POA: Insufficient documentation

## 2017-08-03 DIAGNOSIS — F17200 Nicotine dependence, unspecified, uncomplicated: Secondary | ICD-10-CM | POA: Insufficient documentation

## 2017-08-03 MED ORDER — CLINDAMYCIN HCL 150 MG PO CAPS: 150.0000 mg | ORAL_CAPSULE | Freq: Four times a day (QID) | ORAL | 0 refills | Status: DC

## 2017-08-03 MED ORDER — IBUPROFEN 200 MG PO TABS
600.0000 mg | ORAL_TABLET | Freq: Once | ORAL | Status: AC
  Administered 2017-08-03: 600 mg via ORAL
  Filled 2017-08-03: qty 3

## 2017-08-03 NOTE — ED Provider Notes (Signed)
[facility] Provider Note   CSN: [id number] Arrival date & time: 08/03/17  0343     History   Chief Complaint Chief Complaint  Patient presents with  . Otalgia    HPI Caitlin Terrell is a 32 y.o. female with a history of asthma who presents emergency department today for right ear pain that began yesterday.  Patient reports that yesterday she started developing moderate amount of right ear pain that has associates swelling of her preauricular lymph node.  She reports the pain is moderate, constant and worse with palpation.  Patient reports that she does not take anything for symptoms.  Nothing makes her symptoms better.  She denies history of the same. She denies fever, chills, headache, Q-tip use, foreign body sensation, ear drainage, leading from the ear, barotrauma, tinnitus, hearing changes, nasal congestion, sore throat, rash, neck stiffness, cough or other symptoms.  HPI  Past Medical History:  Diagnosis Date  . Asthma     There are no active problems to display for this patient.   Past Surgical History:  Procedure Laterality Date  . COLONOSCOPY       OB History   None      Home Medications    Prior to Admission medications   Medication Sig Start Date End Date Taking? Authorizing Provider  cyclobenzaprine (FLEXERIL) 10 MG tablet Take 1 tablet (10 mg total) by mouth 2 (two) times daily as needed for muscle spasms. 05/05/17   Roxy HorsemanBrowning, Robert, PA-C  ibuprofen (ADVIL,MOTRIN) 600 MG tablet Take 1 tablet (600 mg total) by mouth every 6 (six) hours as needed. 05/05/17   Roxy HorsemanBrowning, Robert, PA-C  itraconazole (SPORANOX) 100 MG capsule Take 2 capsules (200 mg total) by mouth daily. 2 by mouth daily 7 days 10/19/16   Lorre NickAllen, Anthony, MD  naproxen sodium (ANAPROX) 220 MG tablet Take 660 mg by mouth 2 (two) times daily with a meal.    [provider]    Family History History reviewed. No pertinent family history.  Social  History Social History   Tobacco Use  . Smoking status: Current Some Day Smoker    Types: Cigars  . Smokeless tobacco: Never Used  Substance Use Topics  . Alcohol use: No  . Drug use: No     Allergies   Amoxicillin and Other   Review of Systems Review of Systems  All other systems reviewed and are negative.    Physical Exam Updated Vital Signs BP 106/62 (BP Location: Right Arm)   Pulse 80   Temp 99 F (37.2 C) (Oral)   Resp 18   SpO2 100%   Physical Exam  Constitutional: She appears well-developed and well-nourished. No distress.  HENT:  Head: Normocephalic and atraumatic.  Right Ear: Hearing, external ear and ear canal normal. No foreign bodies. Tympanic membrane is erythematous. Tympanic membrane is not injected, not perforated and not retracted.  Left Ear: Hearing, tympanic membrane, external ear and ear canal normal. No foreign bodies. Tympanic membrane is not injected, not perforated, not erythematous, not retracted and not bulging.  Nose: Nose normal. No mucosal edema, rhinorrhea, sinus tenderness, septal deviation or nasal septal hematoma.  No foreign bodies. Right sinus exhibits no maxillary sinus tenderness and no frontal sinus tenderness. Left sinus exhibits no maxillary sinus tenderness and no frontal sinus tenderness.  No evidence of mastoiditis.  Patient is without mastoid swelling, erythema, edema.  There is no obliteration of postauricular crease. The patient has normal phonation and is in control of  secretions. No stridor.  Midline uvula without edema. Soft palate rises symmetrically.  No tonsillar erythema or exudates. No PTA. Tongue protrusion is normal. No trismus. No creptius on neck palpation and patient has good dentition. No gingival erythema or fluctuance noted. Mucus membranes moist.  Eyes: Pupils are equal, round, and reactive to light. Conjunctivae, EOM and lids are normal. Right eye exhibits no discharge. Left eye exhibits no discharge. Right  conjunctiva is not injected. Left conjunctiva is not injected.  Neck: Trachea normal, normal range of motion, full passive range of motion without pain and phonation normal. Neck supple. No spinous process tenderness and no muscular tenderness present. No neck rigidity. No tracheal deviation and normal range of motion present.  Cardiovascular: Normal rate and regular rhythm.  Pulmonary/Chest: Effort normal and breath sounds normal. No stridor. She has no wheezes.  Abdominal: Soft.  Lymphadenopathy:       Head (right side): Preauricular (tender, swollen and mobile) adenopathy present. No submental, no submandibular, no tonsillar, no posterior auricular and no occipital adenopathy present.       Head (left side): No submental, no submandibular, no tonsillar, no preauricular, no posterior auricular and no occipital adenopathy present.    She has no cervical adenopathy.  Neurological: She is alert.  Skin: Skin is warm and dry. No rash noted.  Psychiatric: She has a normal mood and affect.  Nursing note and vitals reviewed.    ED Treatments / Results  Labs (all labs ordered are listed, but only abnormal results are displayed) Labs Reviewed - No data to display  EKG None  Radiology No results found.  Procedures Procedures (including critical care time)  Medications Ordered in ED Medications - No data to display   Initial Impression / Assessment and Plan / ED Course  I have reviewed the triage vital signs and the nursing notes.  Pertinent labs & imaging results that were available during my care of the patient were reviewed by me and considered in my medical decision making (see chart for details).     32 y.o. female with AOM with assc LAD on exam. Vital signs are reassuring. No evidence of mastoiditis or meningitis.  Will discharge home on antibiotics.  Patient given referral to ENT if lymphadenopathy does not resolve.  She is to follow-up with your PCP otherwise.  Return  precautions discussed.  Patient appears safe for discharge.  Final Clinical Impressions(s) / ED Diagnoses   Final diagnoses:  Right ear pain    ED Discharge Orders        Ordered    clindamycin (CLEOCIN) 150 MG capsule  Every 6 hours     08/03/17 0850       Jacinto Halim, PA-C 08/04/17 1610    Palumbo, April, MD 08/07/17 2337

## 2017-08-03 NOTE — ED Triage Notes (Signed)
Pt reports right ear pain onset x1 day denies event or injury moderate swelling of lymph nodes noted

## 2017-08-03 NOTE — Discharge Instructions (Signed)
You were seen here today for ear pain.  You were found to have an infection of your inner ear. This likely is the cause of your swollen lymph node. Please take all of your antibiotics until finished!   You may develop abdominal discomfort or diarrhea from the antibiotic.  You may help offset this with probiotics which you can buy or get in yogurt. Do not eat or take the probiotics until 2 hours after your antibiotic. Do not take your medicine if develop an itchy rash, swelling in your mouth or lips, or difficulty breathing.  If your swelling or pain does not resolve you will need to follow up with ENT or your PCP in 1 week.  If you develop fever, vomiting, pain/swelling behind your ear, neck stiffness you will need to return to the emergency department.

## 2018-07-10 ENCOUNTER — Ambulatory Visit: Payer: Medicaid - Out of State

## 2018-07-10 ENCOUNTER — Telehealth: Payer: Self-pay | Admitting: Family Medicine

## 2018-07-10 NOTE — Telephone Encounter (Signed)
Patient was notified of rescheduling her appointment due to illness in office.

## 2018-07-16 ENCOUNTER — Encounter: Payer: Self-pay | Admitting: Family Medicine

## 2018-07-16 ENCOUNTER — Ambulatory Visit (INDEPENDENT_AMBULATORY_CARE_PROVIDER_SITE_OTHER): Payer: Medicaid Other | Admitting: Clinical

## 2018-07-16 ENCOUNTER — Other Ambulatory Visit: Payer: Self-pay

## 2018-07-16 ENCOUNTER — Ambulatory Visit (INDEPENDENT_AMBULATORY_CARE_PROVIDER_SITE_OTHER): Payer: Medicaid Other

## 2018-07-16 DIAGNOSIS — Z3201 Encounter for pregnancy test, result positive: Secondary | ICD-10-CM | POA: Diagnosis present

## 2018-07-16 DIAGNOSIS — Z711 Person with feared health complaint in whom no diagnosis is made: Secondary | ICD-10-CM

## 2018-07-16 DIAGNOSIS — F3160 Bipolar disorder, current episode mixed, unspecified: Secondary | ICD-10-CM | POA: Diagnosis not present

## 2018-07-16 LAB — POCT PREGNANCY, URINE: Preg Test, Ur: POSITIVE — AB

## 2018-07-16 NOTE — BH Specialist Note (Signed)
Integrated Behavioral Health via Telemedicine Video Visit  07/16/2018 Caitlin BruinsJessica Sharp [id number]  Number of Integrated Behavioral Health visits: 1 Session Start time: 2:22  Session End time: 3:16 Total time: 55 minutes  Referring Provider: Tinnie Gensanya Pratt, MD Type of Visit: Video [facility]Family [facility] Provider location: [facility] All persons participating in visit: Patient [facility]lin [facility]rell and [facility] C[facility]lin [facility]  Confirmed patient's address: Yes  Confirmed patient's phone number: Yes  Any changes to demographics: No   Confirmed patient's insurance: Pt applying for Medicaid with positive pregnancy today Any changes to patient's insurance: No   Discussed confidentiality: Yes   I connected with Caitlin Terrell  by a video enabled telemedicine application and verified that I am speaking with the correct person using two identifiers.     I discussed the limitations of evaluation and management by telemedicine and the availability of in person appointments.  I discussed that the purpose of this visit is to provide behavioral health care while limiting exposure to the novel coronavirus.   Discussed there is a possibility of technology failure and discussed alternative modes of communication if that failure occurs.  I discussed that engaging in this video visit, they consent to the provision of behavioral healthcare and the services will be billed under their insurance.  Patient and/or legal guardian expressed understanding and consented to video visit: Yes   PRESENTING CONCERNS: Patient and/or family reports the following symptoms/concerns: Pt states her primary concern today is conflicted feelings about unplanned pregnancy as a single mother, and a need to talk through her feelings today. Pt self-reports bipolar disorder that has been managed without medication; will consider a referral to psychiatry at a future appointment.  Duration of problem: Today; Severity of problem:  moderate  STRENGTHS (Protective Factors/Coping Skills): Self-awareness today; strong coping skills.   GOALS ADDRESSED: Patient will: 1.  Reduce symptoms of: mood instability and stress  2.  Increase knowledge and/or ability of: healthy habits and stress reduction  3.  Demonstrate ability to: Increase healthy adjustment to current life circumstances and Increase motivation to adhere to plan of care  INTERVENTIONS: Interventions utilized:  Motivational Interviewing and Psychoeducation and/or Health Education Standardized Assessments completed: GAD-7 and PHQ 9  ASSESSMENT: Patient currently experiencing Bipolar affective disorder, currently active, moderate  Patient may benefit from psychoeducation and brief therapeutic interventions regarding coping with symptoms of mood instability with increased anxiety with positive pregnancy test today .  PLAN: 1. Follow up with behavioral health clinician on : One month(scheduled) or earlier, at patient or provider request 2. Behavioral recommendations:  -Continue with plan to apply for Pregnancy Medicaid -Use [facility] Mobile Crisis number if needed, as discussed -Continue to use self-coping strategies that have worked well in the past (including music daily, taking work breaks when needed, taking photos, cleaning, going to sleep early,etc.) -Continue taking prenatal vitamin daily 3. Referral(s): Integrated [facility] (In Clinic) and [employer] [facility]:  Mobile Crisis  I discussed the assessment and treatment plan with the patient and/or parent/guardian. They were provided an opportunity to ask questions and all were answered. They agreed with the plan and demonstrated an understanding of the instructions.   They were advised to call back or seek an in-person evaluation if the symptoms worsen or if the condition fails to improve as anticipated.  Valetta CloseJamie C Arvind Mexicano  Depression screen [facility] 2[facility] 07/16/2018  Decreased  Interest 3  Down, Depressed, Hopeless 2  PHQ - 2 Score 5  Altered sleeping 1  Tired, decreased energy 3  Change in appetite 3  Feeling bad or failure about yourself  3  Trouble concentrating 0  Moving slowly or fidgety/restless 1  Suicidal thoughts 0  PHQ-9 Score 16   GAD 7 : Generalized Anxiety Score 07/16/2018  Nervous, Anxious, on Edge 3  Control/stop worrying 3  Worry too much - different things 3  Trouble relaxing 0  Restless 0  Easily annoyed or irritable 3  Afraid - awful might happen 1  Total GAD 7 Score 13

## 2018-07-16 NOTE — Progress Notes (Signed)
Pt here today for pregnancy test.  Resulted positive.  Pt denies any pain or bleeding.  Pt did share concern that her last baby was 10 years ago and requested to speak with someone about her feelings.  I explained to the pt that she can speak with our [facility] about her feelings.  Pt reports LMP 05/31/18 which makes EDD 03/07/19 and pt is 6w 4d today.  Medications reconciled.  List of medications safe to take in pregnancy given.  Proof of pregnancy letter given to pt to start prenatal care.    Mel Almond, RN 07/16/18

## 2018-07-22 ENCOUNTER — Other Ambulatory Visit: Payer: Self-pay

## 2018-08-04 ENCOUNTER — Telehealth: Payer: Self-pay | Admitting: Family Medicine

## 2018-08-04 NOTE — Telephone Encounter (Signed)
Spoke to patient about her appointment on 7/21 @ 2:15. Patient instructed that this visit will be a phone visit and she does not have to come to the office for this appointment. Patient instructed to be available around her appointment. Patient verbalized understanding.

## 2018-08-05 ENCOUNTER — Encounter: Payer: Self-pay | Admitting: *Deleted

## 2018-08-05 ENCOUNTER — Other Ambulatory Visit: Payer: Self-pay

## 2018-08-05 ENCOUNTER — Ambulatory Visit (INDEPENDENT_AMBULATORY_CARE_PROVIDER_SITE_OTHER): Payer: Self-pay | Admitting: *Deleted

## 2018-08-05 DIAGNOSIS — O0990 Supervision of high risk pregnancy, unspecified, unspecified trimester: Secondary | ICD-10-CM | POA: Insufficient documentation

## 2018-08-05 DIAGNOSIS — F319 Bipolar disorder, unspecified: Secondary | ICD-10-CM | POA: Insufficient documentation

## 2018-08-05 DIAGNOSIS — Z3490 Encounter for supervision of normal pregnancy, unspecified, unspecified trimester: Secondary | ICD-10-CM

## 2018-08-05 NOTE — Progress Notes (Signed)
I connected with  Caitlin Terrell on 08/05/18 at  2:15 PM EDT by telephone and verified that I am speaking with the correct person using two identifiers.   I discussed the limitations, risks, security and privacy concerns of performing an evaluation and management service by telephone and the availability of in person appointments. I also discussed with the patient that there may be a patient responsible charge related to this service. The patient expressed understanding and agreed to proceed.  New Ob intake completed. Pt advised that her prenatal care will include a combination of virtual, telephonic and face to face visits. Covid visitor restrictions explained for office visits. She does not remember when her last pap was performed however is sure it was >3 years ago. Pt states she had [place] @ [facility] which gave same EDD as her sure LMP. She agreed to check BP weekly and record results to the [facility] portal. She has applied for pregnancy Medicaid and is awaiting final approval. Her BP cuff will be ordered and sent once Medicaid is active. Pt voiced understanding of all information and instructions given.   Day, Ronnell Freshwater, RN 08/05/2018  3:13 PM

## 2018-08-06 NOTE — Progress Notes (Signed)
Patient seen and assessed by nursing staff during this encounter. I have reviewed the chart and agree with the documentation and plan.  Mora Bellman, MD 08/06/2018 10:36 AM

## 2018-08-27 ENCOUNTER — Other Ambulatory Visit (HOSPITAL_COMMUNITY)
Admission: RE | Admit: 2018-08-27 | Discharge: 2018-08-27 | Disposition: A | Discharge status: Home / Self Care | Payer: Medicaid Other | Source: Ambulatory Visit | Attending: Obstetrics and Gynecology | Admitting: Obstetrics and Gynecology

## 2018-08-27 ENCOUNTER — Other Ambulatory Visit: Payer: Self-pay

## 2018-08-27 ENCOUNTER — Ambulatory Visit (INDEPENDENT_AMBULATORY_CARE_PROVIDER_SITE_OTHER): Payer: Self-pay | Admitting: Obstetrics and Gynecology

## 2018-08-27 ENCOUNTER — Encounter: Payer: Self-pay | Admitting: Obstetrics and Gynecology

## 2018-08-27 VITALS — BP 131/74 | HR 90 | Wt 173.0 lb

## 2018-08-27 DIAGNOSIS — J45909 Unspecified asthma, uncomplicated: Secondary | ICD-10-CM | POA: Insufficient documentation

## 2018-08-27 DIAGNOSIS — J4520 Mild intermittent asthma, uncomplicated: Secondary | ICD-10-CM

## 2018-08-27 DIAGNOSIS — R51 Headache: Secondary | ICD-10-CM

## 2018-08-27 DIAGNOSIS — Z3A12 12 weeks gestation of pregnancy: Secondary | ICD-10-CM

## 2018-08-27 DIAGNOSIS — O219 Vomiting of pregnancy, unspecified: Secondary | ICD-10-CM

## 2018-08-27 DIAGNOSIS — Z3490 Encounter for supervision of normal pregnancy, unspecified, unspecified trimester: Secondary | ICD-10-CM | POA: Insufficient documentation

## 2018-08-27 DIAGNOSIS — O26891 Other specified pregnancy related conditions, first trimester: Secondary | ICD-10-CM

## 2018-08-27 MED ORDER — ALBUTEROL SULFATE (2.5 MG/3ML) 0.083% IN NEBU: 2.5000 mg | INHALATION_SOLUTION | Freq: Four times a day (QID) | RESPIRATORY_TRACT | 12 refills | Status: DC | PRN

## 2018-08-27 MED ORDER — BUTALBITAL-APAP-CAFFEINE 50-325-40 MG PO TABS: 2.0000 | ORAL_TABLET | Freq: Four times a day (QID) | ORAL | 0 refills | Status: DC | PRN

## 2018-08-27 MED ORDER — DOXYLAMINE-PYRIDOXINE 10-10 MG PO TBEC: 2.0000 | DELAYED_RELEASE_TABLET | Freq: Every day | ORAL | 2 refills | Status: DC

## 2018-08-27 MED ORDER — ALBUTEROL SULFATE HFA 108 (90 BASE) MCG/ACT IN AERS: 1.0000 | INHALATION_SPRAY | Freq: Four times a day (QID) | RESPIRATORY_TRACT | 3 refills | Status: DC | PRN

## 2018-08-27 NOTE — Progress Notes (Signed)
Patient seen and evaluated by resident. Agree with assessment and plan

## 2018-08-27 NOTE — Progress Notes (Signed)
   PRENATAL VISIT NOTE  Subjective:  Caitlin Terrell is a 33 y.o. G2P1001 at [redacted]w[redacted]d being seen today for ongoing prenatal care.  She is currently monitored for the following issues for this low-risk pregnancy and has Supervision of low-risk pregnancy; Bipolar disease in pregnancy [facility]); and Asthma on their problem list.  Patient reports daily headaches despite taking extra strength Tylenol. Headaches are located frontrally and worse after looking at a screen all day. Denies hx of migraines. Does admit she does not drink much water. Denies speech difficulties, vision changes, numbness/weakness in extremities, confusion.  Contractions: Not present. Vag. Bleeding: None.   . Denies leaking of fluid.   The following portions of the patient's history were reviewed and updated as appropriate: allergies, current medications, past family history, past medical history, past social history, past surgical history and problem list. Previously smoking THC but has now stopped.   Objective:   Vitals:   08/27/18 1531  BP: 131/74  Pulse: 90  Weight: 173 lb (78.5 kg)    Fetal Status:           General:  Alert, oriented and cooperative. Patient is in no acute distress.  Skin: Skin is warm and dry. No rash noted.   Cardiovascular: Normal heart rate noted  Respiratory: Normal respiratory effort, no problems with respiration noted  Abdomen: Soft, gravid, appropriate for gestational age.  Pain/Pressure: Present     Pelvic: Normal appearance of vagina and cervix.  Extremities: Normal range of motion.  Edema: None  Mental Status: Normal mood and affect. Normal behavior. Normal judgment and thought content.   Assessment and Plan:  Pregnancy: G2P1001 at [redacted]w[redacted]d 1. Encounter for supervision of low-risk pregnancy, antepartum - Will start low dose ASA due to BMI - Sure LMP; anatomy US scheduled  - hx of 1 prior SVD without complications > 10 years ago - Culture, OB Urine - Genetic Screening - Obstetric Panel,  Including HIV - Hemoglobin A1c - Cytology - PAP( [place]) - GC/Chlamydia probe amp ([place])not at [facility] - [place] MFM OB COMP + 14 WK; Future  2. Mild intermittent asthma without complication - Albuterol ordered per patient request; currently does not have an inhaler. No symptoms currently.   3. Nausea and Vomiting of Pregnancy - Will trial Diclegis. Vitals stable. Able to tolerate PO. Return precautions given.   4. Headaches in pregnancy - Will trial Fioricet. Return precautions given. Could consider MgOx BID dosing for ppx or riboflavin. Could also refer to headache clinic if symptoms persist. Recommended one break per hour from screen for a few minutes and increasing fluid intake.   Preterm labor symptoms and general obstetric precautions including but not limited to vaginal bleeding, contractions, leaking of fluid and fetal movement were reviewed in detail with the patient. Please refer to After Visit Summary for other counseling recommendations.   Return in about 4 weeks (around 09/24/2018) for LOB, Virtual .  Future Appointments  Date Time Provider [place]  10/15/2018  8:00 AM WH-MFC [place] 3 WH-MFCUS MFC-US    Chauncey Mann, MD

## 2018-08-27 NOTE — Progress Notes (Signed)
Anatomy US scheduled for September 30th @ 0800.  Pt notified.

## 2018-08-27 NOTE — Patient Instructions (Addendum)
BENEFITS OF BREASTFEEDING Many women wonder if they should breastfeed. Research shows that breast milk contains the perfect balance of vitamins, protein and fat that your baby needs to grow. It also contains antibodies that help your baby's immune system to fight off viruses and bacteria and can reduce the risk of sudden infant death syndrome (SIDS). In addition, the colostrum (a fluid secreted from the breast in the first few days after delivery) helps your newborn's digestive system to grow and function well. Breast milk is easier to digest than formula. Also, if your baby is born preterm, breast milk can help to reduce both short- and long-term health problems. BENEFITS OF BREASTFEEDING FOR MOM . Breastfeeding causes a hormone to be released that helps the uterus to contract and return to its normal size more quickly. . It aids in postpartum weight loss, reduces risk of breast and ovarian cancer, heart disease and rheumatoid arthritis. . It decreases the amount of bleeding after the baby is born. benefits of breastfeeding for baby . Provides comfort and nutrition . Protects baby against - Obesity - Diabetes - Asthma - Childhood cancers - Heart disease - Ear infections - Diarrhea - Pneumonia - Stomach problems - Serious allergies - Skin rashes . Promotes growth and development . Reduces the risk of baby having Sudden Infant Death Syndrome (SIDS) only breastmilk for the first 6 months . Protects baby against diseases/allergies . It's the perfect amount for tiny bellies . It restores baby's energy . Provides the best nutrition for baby . Giving water or formula can make baby more likely to get sick, decrease Mom's milk supply, make baby less content with breastfeeding Skin to Skin After delivery, the staff will place your baby on your chest. This helps with the following: . Regulates baby's temperature, breathing, heart rate and blood sugar . Increases Mom's milk supply . Promotes  bonding . Keeps baby and Mom calm and decreases baby's crying Rooming In Your baby will stay in your room with you for the entire time you are in the hospital. This helps with the following: . Allows Mom to learn baby's feeding cues - Fluttering eyes - Sucking on tongue or hand - Rooting (opens mouth and turns head) - Nuzzling into the breast - Bringing hand to mouth . Allows breastfeeding on demand (when your baby is ready) . Helps baby to be calm and content . Ensures a good milk supply . Prevents complications with breastfeeding . Allows parents to learn to care for baby . Allows you to request assistance with breastfeeding Importance of a good latch . Increases milk transfer to baby - baby gets enough milk . Ensures you have enough milk for your baby . Decreases nipple soreness . Don't use pacifiers and bottles - these cause baby to suck differently than breastfeeding . Promotes continuation of breastfeeding Risks of Formula Supplementation with Breastfeeding Giving your infant formula in addition to your breast-milk EXCEPT when medically necessary can lead to: Marland Kitchen Decreases your milk supply  . Loss of confidence in yourself for providing baby's nutrition  . Engorgement and possibly mastitis  . Asthma & allergies in the baby BREASTFEEDING FAQS How long should I breastfeed my baby? It is recommended that you provide your baby with breast milk only for the first 6 months and then continue for the first year and longer as desired. During the first few weeks after birth, your baby will need to feed 8-12 times every 24 hours, or every 2-3 hours. They will likely feed  for 15-30 minutes. How can I help my baby begin breastfeeding? Babies are born with an instinct to breastfeed. A healthy baby can begin breastfeeding right away without specific help. At the hospital, a nurse (or lactation consultant) will help you begin the process and will give you tips on good positioning. It may be  helpful to take a breastfeeding class before you deliver in order to know what to expect. How can I help my baby latch on? In order to assist your baby in latching-on, cup your breast in your hand and stroke your baby's lower lip with your nipple to stimulate your baby's rooting reflex. Your baby will look like he or she is yawning, at which point you should bring the baby towards your breast, while aiming the nipple at the roof of his or her mouth. Remember to bring the baby towards you and not your breast towards the baby. How can I tell if my baby is latched-on? Your baby will have all of your nipple and part of the dark area around the nipple in his or her mouth and your baby's nose will be touching your breast. You should see or hear the baby swallowing. If the baby is not latched-on properly, start the process over. To remove the suction, insert a clean finger between your breast and the baby's mouth. Should I switch breasts during feeding? After feeding on one side, switch the baby to your other breast. If he or she does not continue feeding - that is OK. Your baby will not necessarily need to feed from both breasts in a single feeding. On the next feeding, start with the other breast for efficiency and comfort. How can I tell if my baby is hungry? When your baby is hungry, they will nuzzle against your breast, make sucking noises and tongue motions and may put their hands near their mouth. Crying is a late sign of hunger, so you should not wait until this point. When they have received enough milk, they will unlatch from the breast. Is it okay to use a pacifier? Until your baby gets the hang of breastfeeding, experts recommend limiting pacifier usage. If you have questions about this, please contact your pediatrician. What can I do to ensure proper nutrition while breastfeeding? . Make sure that you support your own health and your baby's by eating a healthy, well-balanced diet . Your provider  may recommend that you continue to take your prenatal vitamin . Drink plenty of fluids. It is a good rule to drink one glass of water before or after feeding . Alcohol will remain in the breast milk for as long as it will remain in the blood stream. If you choose to have a drink, it is recommended that you wait at least 2 hours before feeding . Moderate amounts of caffeine are OK . Some over-the-counter or prescription medications are not recommended during breastfeeding. Check with your provider if you have questions What types of birth control methods are safe while breastfeeding? Progestin-only methods, including a daily pill, an IUD, the implant and the injection are safe while breastfeeding. Methods that contain estrogen (such as combination birth control pills, the vaginal ring and the patch) should not be used during the first month of breastfeeding as these can decrease your milk supply.  AREA PEDIATRIC/FAMILY PRACTICE PHYSICIANS  [place] [phone]) . [facility] Davy Pique, MD; Gwendlyn Deutscher, MD; Walker Kehr, MD; Andria Frames, MD; McDiarmid, MD; Dutch Quint, MD; Nori Riis, MD; Mingo Amber, [place]  [address] [place], [place] [address] o [phone] o Mon-Fri 8:30-12:30, 1:30-5:00 o Providers come to see babies at [place] Medicaid . [place] at [place] providers who accept newborns: Dorthy Cooler, MD; Orland Mustard, MD; Stephanie Acre, MD o [place], [place], [place] [address] o [phone] o Mon-Fri 8:00-5:30 o Babies seen by providers at [facility] o Does NOT accept Medicaid o Please call early in hospitalization for appointment (limited availability)  . Mustard [place], MD o [address], [place], [place] [address] o [phone] o Mon, Tue, Thur, Fri 8:30-5:00, Wed 10:00-7:00 (closed 1-2pm) o Babies seen by [facility] providers o Accepting Medicaid . Quay, MD o [place], [place], [place] [address] o [phone] o Mon-Fri 8:30-5:00, Sat 8:30-12:00 o Provider comes to see babies at [place] Medicaid o Must have been referred from current patients or contacted office prior to delivery . [place] [facility] ([place] for [place]) Franne Forts, MD; Tamera Punt, MD; Doneen Poisson, MD; Fatima Sanger, MD; Wynetta Emery, MD; Jess Barters, MD; Tami Ribas, MD; Herbert Moors, MD; Derrell Lolling, MD; Dorothyann Peng, MD; Lucious Groves, NP; Baldo Ash, NP o [address], [place], [place] [address] o [phone] o Mon, Tue, Thur, Fri 8:30-5:30, Wed 9:30-5:30, Sat 8:30-12:30 o Babies seen by [facility] providers o Accepting Medicaid o Only accepting infants of first-time parents or siblings of current patients [facility] discharge coordinator will make follow-up appointment . Baltazar Najjar o [place] [address] [place], [place]  [address] o [phone]   Fax - [phone] . [facility] o [address], [place], [place]  [address] o Phone - [phone]   Fax [phone] . [place], [place], [place], [place]  [address] o [phone]  [place] [phone]) . [facility] Reginal Lutes, MD; Jacklynn Ganong, MD; Torrie Mayers, MD; MD; Rosana Hoes, MD; Servando Salina, MD; Rose Fillers, MD; Rex Kras, MD; Corinna Capra, MD; Volney American, MD; Trilby Drummer, MD; Janann Colonel, MD; Jimmye Norman, [place], [place], [place] [address] o [phone] o Mon-Fri 8:30-5:00 (extended evenings Mon-Thur as needed), Sat-Sun 10:00-1:00 o Providers come to see babies at [place] Medicaid for families of first-time babies and families with all children in the household age 39 and under. Must register with office prior to making appointment (M-F only). . [place], NP; Tomi Bamberger, MD; Redmond School, MD; [place], [place] [address] [place], [place]  [address] o [phone] o Mon-Fri 8:00-5:00 o Babies seen by providers at [facility] o Does NOT accept Medicaid/Commercial Insurance Only . [facility] [place] ([facility])  Marnee Guarneri, MD; Drema [place], MD; Montine Circle, MD; Vilma Prader, MD; Vanita Panda, MD; Alfonso Ramus, MD; Ruthann Cancer, MD; Roxanne Mins, MD; Rosalva Ferron, MD; Polly Cobia, MD o [address] [place], [place] [address] o [phone] o Mon-Fri 8:30-5:30, Sat (Oct.-Mar.) 9:00-1:00 o Babies seen by providers at [place] [phone]) . [facility] Peers, MD; Suzan Slick, MD o [address], [place], [place] [address] o [phone] o Mon-Fri 8:30-5:00, Sat 8:30-12:00 o Providers come to see babies at [facility] o Does NOT accept Medicaid . [place] at [place], [place]; Mannie Stabile, MD; [place], [place]; Nancy Fetter, MD; Moreen Fowler, [place], [place], [place] [address] o [phone] o Mon-Fri 8:00-5:00 o Babies seen by providers at [facility]  o Does NOT accept Medicaid o Only accepting babies of parents who are patients o Please call early in hospitalization for appointment (limited availability) . [facility] Pediatricians Blanca Friend, MD; Sharlene Motts, MD; Rod Can, MD; Warner Mccreedy, NP; Sabra Heck, MD; Ermalinda Memos, MD; Sharlett Iles, NP; Aurther Loft, MD; Jerrye Beavers, MD; Marcello Moores, MD; Berline Lopes, MD; Charolette Forward, MD o [place]. [place], [place], [place] [address] o [phone] o Mon-Fri 8:00-5:00, Sat 9:00-12:00 o Providers come to see babies at [facility] o Does NOT accept [facility] [phone]) . [place] [facility] providers accepting new patients: Dayna Ramus, NP; [place], [place], [place], [place] [address] o [phone] o Mon-Fri 8:00-5:00 o Babies seen by providers at [facility] o Does NOT accept Medicaid o Only accepting babies of parents who are  patients o Please call early in hospitalization for appointment (limited availability) . [facility] Oswaldo Conroy, MD; Sheran Lawless, MD o [address] [place], [place] [address] o [phone] (press 1 to schedule appointment) o Mon-Fri 8:00-5:00 o Providers come to see babies at [facility] o Does NOT accept Medicaid . [facility] Jodi Mourning, MD o [address], [place], [place] [address] o [phone] o Mon-Fri 8:30-5:00 (lunch 12:30-1:00), extended hours by appointment only Wed 5:00-6:30 o Babies seen by [facility] providers o Accepting Medicaid . [place] at Evalyn Casco, MD; [place], MD; Ethlyn Gallery, MD o [place], [place], [place] [address] o [phone] o Mon-Fri 8:00-5:00 o Babies seen by [facility] providers o Does NOT accept Medicaid . Therapist, music at [place], MD; Yong Channel, MD; [place], [place]., [place], [place] [address] o [phone] o Mon-Fri 8:00-5:00 o Babies seen by [facility] providers o Does NOT accept Medicaid . [place], [place]; [place], [place]; [place], NP; Albertina Parr, MD; Frederic Jericho, MD; Ronney Lion, MD; Carlos Levering, NP; Jerelene Redden, NP; Tomasita Crumble, NP; Ronelle Nigh, NP; Corinna Lines, MD; [place], MD o [address] [place], [place] [address] o [phone] o Mon-Fri 8:30-5:00, Sat 10:00-1:00 o Providers come to see babies at [facility] o Does NOT accept Medicaid o Free prenatal information session Tuesdays at 4:45pm . [facility] Porfirio Oar, MD; [place], [place]; [place], [place]; [place], [place]., [place] [address] o [phone] o Mon-Fri 7:30-5:30 o Babies seen by [facility] providers . [facility] Children's Doctor o [address], [place], [place], [place]  [address] o [phone]   Fax - [phone]  [place] [phone] & [phone]) . [place] o [address] [place], [place]  [address] o [phone] o Mon-Thur 8:00-6:00 o Providers come to see babies at [place] Medicaid . Philip, NP; Melford Aase, MD; [place], [place]; [place], [place]., [place], [place] [address] o [phone] o Mon-Thur 7:30-7:30, Fri 7:30-4:30 o Babies seen by [facility] providers o Accepting Medicaid . [facility] Nyra Jabs, MD; Cristino Martes, NP; Gertie Baron, MD o [place] [address], [place], [place] [address] o [phone] o Mon-Fri 8:30-5:00, Sat 8:30-12:00 o Providers come to see babies at [place] Medicaid o Must have "Meet & Greet" appointment at office prior to delivery . [place] ([place]) Jodene Nam, MD; Juleen China, MD; Clydene Laming, [place] [address], [place], [place] [address] o [phone] o Mon-Wed 8:00-6:00, Thur-Fri 8:00-5:00, Sat 9:00-12:00 o Providers come to see babies at [facility]  o Does NOT accept Medicaid o Only accepting siblings of current patients . [facility], [place], [place], [place]  [address] o [phone]   Fax [phone] . [place] at [address], [place], [place]  [address] o [phone]   Fax - [place] [phone] & [phone]) . Therapist, music at [place], DO; [place], [place]., [place], [place] [address] o [phone] o Mon-Fri 7:00-5:00 o Babies seen by [facility] providers o Does NOT accept Medicaid . [place], MD; [place], [place]; [place], [place], [place], [place] [address] o [phone] o Mon-Fri 8:00-5:00 o Babies seen by [facility] providers o Accepting Medicaid . [place] Crest, MD; [place], [place]; [place], NP; [place],  [place], [place], [place] [address] o [phone] o Mon-Fri 8:00-5:00 o Babies seen by providers at [place] [phone]) . [facility] [place], [place] o [place]., [place], [place] [address] o [phone] o Mon-Fri 8:00-5:00 o Babies seen by [facility] providers o Does NOT accept Medicaid o Limited availability, please call early in hospitalization to schedule follow-up . [facility] Leilani Merl, PA; Maisie Fus, MD; [place], MD; [place], [place]; Jeannine Kitten, MD; [place], [facility] [address], [place], [place] [address] o [phone] o Mon-Fri 8:30-5:00, Sat 9:00-12:00 o Babies seen by providers at [facility] o Accepting Medicaid o Please register online then schedule online or call office o [email or web address] . [place] ([place] [facility] [place]) Kristian Covey, NP; Dwyane Dee, MD; Leonidas Romberg, PA o [address] [place], [place], [place] [address] o [phone] o Mon-Fri 8:00-5:00 o Babies seen by providers at [facility] o Accepting Medicaid . [place] ([facility] [employer]) Dairl Ponder, MD; Rayvon Char, NP; Melina Modena, MD o [address] [place], [place], [place] [address] o [phone] o Mon-Fri 8:00-5:30, Sat&Sun by appointment (phones open at 8:30) o Babies seen by [facility] providers o Accepting Medicaid o Must be a first-time baby or sibling of current patient . [place], [address], [place],   [address] o [phone]   Fax - [phone]  [place] [phone] & [phone]) . [place], [place]; [place], [place]; Benjamine Mola, MD; [place], [place]; Harrell Lark, MD o [address] [place], [place] [address] o [phone] o Mon-Thur 8:00-7:00, Fri 8:00-5:00, Sat 8:00-12:00, Sun 9:00-12:00 o Babies seen by [facility] providers o Accepting Medicaid . [facility], MD; Ruthann Cancer, MD; [facility], MD o [address], [place],  [address] o [phone] o Mon-Thur 8:00-5:00 o Babies seen by providers at [facility] o Accepting Medicaid . [facility] at [place], MD; Coe[place], MD; Amedeo Plenty, MD; Bobby Rumpf, MD; List, MD; Lavonia Drafts, MD; Ruthann Cancer, MD; Selinda Eon, MD; Audie Box, MD; Jim Like, MD; Christie Nottingham, MD; Hubbard Hartshorn, MD; Modena Nunnery, MD o [place]., [place], [place] [address] o [phone] o Mon-Fri 8:00-5:30, Sat (Oct.-Mar.) 9:00-1:00 o Babies seen by providers at [facility] o Accepting Medicaid o Must fill out new patient packet, available online at [email or web address] . [place] ([facility]) Barnabas Lister, NP; Kenton Kingfisher, NP; Claiborne Billings, NP; Rolla Plate, MD; [place], [place]; Carola Rhine, MD; Tyron Russell, MD; Delia Chimes, NP o [address], [place], [place] [address] o [phone] o Mon-Thur 8:00-5:30, Fri 8:00-5:00 o Babies seen by providers at [place] [phone]) . [place], [place]; [place], MD; Dennard Schaumann, MD; [place], [place] o [address] [place], [place] [address] o [phone] o Mon-Fri 8:00-5:00 o Babies seen by providers at [place] [phone]) . [place] at [place], [place]; Olen Pel, MD; [place], [place], [place], [place] [address] o [phone] o Mon-Fri 8:00-5:00 o Babies seen by providers at [facility] o Does NOT accept Medicaid o Limited appointment availability, please call early in hospitalization  . Therapist, music at [place], [place]; [place], [address] [place], [place] [address] o [phone] o Mon-Fri 8:00-5:00 o Babies seen by [facility] providers o Does NOT accept Medicaid . [facility] Su Grand, MD; Guy Sandifer, MD; [place], [place]; [place], [place] [address], [place], [place] [address] o [phone] o Mon-Fri 8:00-5:00 o After hours clinic [facility] [place], [place] [address]) [phone] Mon-Fri 5:00-8:00, Sat 12:00-6:00, Sun 10:00-4:00 o Babies seen by [facility] providers o Accepting Medicaid . [place] at [facility] o [address] [place], [place]  [address] o [phone]   Fax - [phone]  [place] [phone]) . Therapist, music at [facility], MD o [address] [place], [place], [place] [address] o [phone] o Mon-Fri 8:00-5:00 o Babies seen by [facility] providers o Does NOT accept Medicaid . [place] ([place] at [place]) Bing Neighbors, MD o [address] [place], [place], [place] [address] o [phone] o Mon-Thur 8:00-7:00, Fri 8:00-5:00, Sat 8:00-12:00 o Babies seen by providers at [facility] o Accepting Medicaid - but does not have vaccinations in office (must be received elsewhere) o Limited availability, please call early in hospitalization  [place] ([address]) . [place], MD o [address], [place] [address] o [phone]  Fax [phone]  Safe Medications in Pregnancy   Acne:  Benzoyl Peroxide  Salicylic Acid   Backache/Headache:  Tylenol: 2 regular strength every 4 hours OR        2 Extra strength every 6 hours   Colds/Coughs/Allergies:  Benadryl (alcohol free) 25 mg every 6 hours as needed  Breath right strips  Claritin  Cepacol throat lozenges  Chloraseptic throat spray  Cold-Eeze- up to three times per day  Cough drops, alcohol free  Flonase (by prescription only)  Guaifenesin  Mucinex  Robitussin DM (plain only, alcohol free)  Saline nasal spray/drops  Sudafed  (pseudoephedrine) & Actifed * use only after [redacted] weeks gestation and if you do not have high blood pressure  Tylenol  Vicks Vaporub  Zinc lozenges  Zyrtec   Constipation:  Colace  Ducolax suppositories  Fleet enema  Glycerin suppositories  Metamucil  Milk of magnesia  Miralax  Senokot  Smooth move tea   Diarrhea:  Kaopectate  Imodium A-D   *NO pepto Bismol   Hemorrhoids:  Anusol  Anusol HC  Preparation H  Tucks   Indigestion:  Tums  Maalox  Mylanta  Zantac  Pepcid   Insomnia:  Benadryl (alcohol free) '25mg'$  every 6 hours as needed  Tylenol PM  Unisom, no Gelcaps   Leg Cramps:  Tums  MagGel   Nausea/Vomiting:  Bonine  Dramamine  Emetrol  Ginger extract  Sea bands  Meclizine  Nausea medication to take during pregnancy:  Unisom (doxylamine succinate 25 mg tablets) Take one tablet daily at bedtime. If symptoms are not adequately controlled, the dose can be increased to a maximum recommended dose of two tablets daily (1/2 tablet in the morning, 1/2 tablet mid-afternoon and one at bedtime).  Vitamin B6 '100mg'$  tablets. Take one tablet twice a day (up to 200 mg per day).   Skin Rashes:  Aveeno products  Benadryl cream or '25mg'$  every 6 hours as needed  Calamine Lotion  1% cortisone cream   Yeast infection:  Gyne-lotrimin 7  Monistat 7    **If taking multiple medications, please check labels to avoid duplicating the same active ingredients  **take medication as directed on the label  ** Do not exceed 4000 mg of tylenol in 24 hours  **Do not take medications that contain aspirin or ibuprofen         Childbirth Education Options: [facility] Department Classes:  Childbirth education classes can help you get ready for a positive parenting experience. You can also meet other expectant parents and get free stuff for your baby. Each class runs for five weeks on the same night and costs $45 for the mother-to-be and her support person. Medicaid  covers the cost if you are eligible. Call [phone] to register. [facility] Childbirth Education:  [phone] or [phone] or [email or web address]  Baby & Me Class: Discuss newborn & infant parenting and family adjustment issues with other new mothers in a relaxed environment. Each week brings a new speaker or baby-centered activity. We encourage new mothers to join [place] every Thursday at 11:00am. Babies birth until crawling. No registration or fee. Daddy [employer]: This course offers Dads-to-be the tools and knowledge needed to feel confident on their journey to becoming new fathers. Experienced dads, who have been trained as coaches, teach dads-to-be how to hold, comfort, diaper, swaddle and play with their infant while being able to support the new mom as well. A class for men taught by men. $25/dad Big Brother/Big Sister: Let your children share in the joy of a new brother or sister in this special class designed just for them. Class includes discussion about how families care for babies: swaddling, holding, diapering, safety as well as how they can be helpful in their new role. This class is designed for children ages 38 to 75, but any age is welcome. Please register each child individually. $5/child  Mom Talk: This mom-led group offers support and connection to mothers as they journey through the adjustments and struggles of that sometimes overwhelming first year after the birth of a child. Tuesdays at 10:00am and Thursdays at 6:00pm. Babies welcome. No registration or fee. Breastfeeding Support Group: This group is a mother-to-mother support circle where moms have the opportunity to share their breastfeeding experiences. A Lactation Consultant is present for questions and concerns. Meets each Tuesday at 11:00am. No fee or registration. Breastfeeding Your Baby: Learn what to expect in the first days of breastfeeding your newborn.  This class will help you feel more confident with the  skills needed to begin your breastfeeding experience. Many new mothers are concerned about breastfeeding after leaving the hospital. This class will also address the most common  fears and challenges about breastfeeding during the first few weeks, months and beyond. (call for fee) Comfort Techniques and Tour: This 2 hour interactive class will provide you the opportunity to learn & practice hands-on techniques that can help relieve some of the discomfort of labor and encourage your baby to rotate toward the best position for birth. You and your partner will be able to try a variety of labor positions with birth balls and rebozos as well as practice breathing, relaxation, and visualization techniques. A tour of the [facility] is included with this class. $20 per registrant and support person Childbirth Class- Weekend Option: This class is a Weekend version of our Birth & Baby series. It is designed for parents who have a difficult time fitting several weeks of classes into their schedule. It covers the care of your newborn and the basics of labor and childbirth. It also includes a [place] [facility] and lunch. The class is held two consecutive days: beginning on Friday evening from 6:30 - 8:30 p.m. and the next day, Saturday from 9 a.m. - 4 p.m. (call for fee) Doren Custard Class: Interested in a waterbirth?  This informational class will help you discover whether waterbirth is the right fit for you. Education about waterbirth itself, supplies you would need and how to assemble your support team is what you can expect from this class. Some obstetrical practices require this class in order to pursue a waterbirth. (Not all obstetrical practices offer waterbirth-check with your healthcare provider.) Register only the expectant mom, but you are encouraged to bring your partner to class! Required if planning waterbirth, no fee. Infant/Child CPR: Parents,  grandparents, babysitters, and friends learn Cardio-Pulmonary Resuscitation skills for infants and children. You will also learn how to treat both conscious and unconscious choking in infants and children. This Family & Friends program does not offer certification. Register each participant individually to ensure that enough mannequins are available. (Call for fee) Grandparent Love: Expecting a grandbaby? This class is for you! Learn about the latest infant care and safety recommendations and ways to support your own child as he or she transitions into the parenting role. Taught by Registered Nurses who are childbirth instructors, but most importantly...they are grandmothers too! $10/person. Childbirth Class- Natural Childbirth: This series of 5 weekly classes is for expectant parents who want to learn and practice natural methods of coping with the process of labor and childbirth. Relaxation, breathing, massage, visualization, role of the partner, and helpful positioning are highlighted. Participants learn how to be confident in their body's ability to give birth. This class will empower and help parents make informed decisions about their own care. Includes discussion that will help new parents transition into the immediate postpartum period. [facility] is included. We suggest taking this class between 25-32 weeks, but it's only a recommendation. $75 per registrant and one support person or $30 Medicaid. Childbirth Class- 3 week Series: This option of 3 weekly classes helps you and your labor partner prepare for childbirth. Newborn care, labor & birth, cesarean birth, pain management, and comfort techniques are discussed and a [place] [facility] is included. The class meets at the same time, on the same day of the week for 3 consecutive weeks beginning with the starting date you choose. $60 for registrant and one support person.  Marvelous  Multiples: Expecting twins, triplets, or more? This class covers the differences in labor, birth,  parenting, and breastfeeding issues that face multiples' parents. NICU tour is included. Led by a Certified Childbirth Educator who is the mother of twins. No fee. Caring for Baby: This class is for expectant and adoptive parents who want to learn and practice the most up-to-date newborn care for their babies. Focus is on birth through the first six weeks of life. Topics include feeding, bathing, diapering, crying, umbilical cord care, circumcision care and safe sleep. Parents learn to recognize symptoms of illness and when to call the pediatrician. Register only the mom-to-be and your partner or support person can plan to come with you! $10 per registrant and support person Childbirth Class- online option: This online class offers you the freedom to complete a Birth and Baby series in the comfort of your own home. The flexibility of this option allows you to review sections at your own pace, at times convenient to you and your support people. It includes additional video information, animations, quizzes, and extended activities. Get organized with helpful eClass tools, checklists, and trackers. Once you register online for the class, you will receive an email within a few days to accept the invitation and begin the class when the time is right for you. The content will be available to you for 60 days. $60 for 60 days of online access for you and your support people.  Local Doulas: Natural Baby Doulas [email or web address] Tel: [phone] [email or web address] [employer] [phone] [email or web address] The Labor Hassell Halim  (also do waterbirth tub rental) [phone] [email or web address] [facility] Birth Doula [phone] [email or web address] NotebookDistributors.fi [facility] Rhythms   [phone] [email or web address] [employer] (PADA) [email or web address] [place] Birth [facility] Baby  [email or web address] Considering Waterbirth? Guide for patients at [facility] [employer]  Why consider waterbirth?  . Gentle birth for babies . Less pain medicine used in labor . May allow for passive descent/less pushing . May reduce perineal tears  . More mobility and instinctive maternal position changes . Increased maternal relaxation . Reduced blood pressure in labor  Is waterbirth safe? What are the risks of infection, drowning or other complications?  . Infection: o Very low risk (3.7 % for tub vs 4.8% for bed) o 7 in 8000 waterbirths with documented infection o Poorly cleaned equipment most common cause o Slightly lower group B strep transmission rate  . Drowning o Maternal:  - Very low risk   - Related to seizures or fainting o Newborn:  - Very low risk. No evidence of increased risk of respiratory problems in multiple large studies - Physiological protection from breathing under water - Avoid underwater birth if there are any fetal complications - Once baby's head is out of the water, keep it out.  . Birth complication o Some reports of cord trauma, but risk decreased by bringing baby to surface gradually o No evidence of increased risk of shoulder dystocia. Mothers can usually change positions faster in water than in a bed, possibly aiding the maneuvers to free the shoulder.   You must attend a Doren Custard class at [facility]  3rd Wednesday of every month from 7-9pm  [employer] by calling [phone] or online at [email or web address]  Bring [place] the certificate from the class to your prenatal appointment  Meet with a midwife at 36 weeks to see if you can still plan a waterbirth and to sign the consent.   Purchase or rent the following supplies:   Water  Birth Pool (Birth Pool in a Box or [place] for instance)  (Tubs start ~$125)  Single-use disposable tub liner designed for your brand of tub  New garden hose labeled "lead-free", "suitable for drinking water",  Electric drain pump to remove water (We recommend 792 gallon per hour or greater pump.)   Separate garden hose to remove the dirty water  Fish net  Bathing suit top (optional)  Long-handled mirror (optional)  Places to purchase or rent supplies  GotWebTools.is for tub purchases and supplies  Waterbirthsolutions.com for tub purchases and supplies  The Labor Ladies (www.[email or web address]) $275 for tub rental/set-up & take down/kit   [employer] (http://www.fleming.com/.htm) Information regarding doulas (labor support) who provide pool rentals  Our practice has a Birth Pool in a Box tub at the hospital that you may borrow on a first-come-first-served basis. It is your responsibility to to set up, clean and break down the tub. We cannot guarantee the availability of this tub in advance. You are responsible for bringing all accessories listed above. If you do not have all necessary supplies you cannot have a waterbirth.    Things that would prevent you from having a waterbirth:  Premature, <37wks  Previous cesarean birth  Presence of thick meconium-stained fluid  Multiple gestation (Twins, triplets, etc.)  Uncontrolled diabetes or gestational diabetes requiring medication  Hypertension requiring medication or diagnosis of pre-eclampsia  Heavy vaginal bleeding  Non-reassuring fetal heart rate  Active infection (MRSA, etc.). Group B Strep is NOT a contraindication for  waterbirth.  If your labor has to be induced and induction method requires continuous  monitoring of the baby's heart rate  Other risks/issues identified by your obstetrical provider  Please remember that birth is unpredictable. Under certain unforeseeable  circumstances your provider may advise against giving birth in the tub. These decisions will be made on a case-by-case basis and with the safety of you and your baby as our highest priority.   We recommend a 10 minute break every hour for patient to have a break for bathroom and screen-time break.   Instructions: Please start taking a baby aspirin daily (81 mg). You can start taking Diclegis for nausea as instructed. For headaches, please start using Fioricet instead of Tylenol. If this does not help, let us know.

## 2018-08-28 LAB — OBSTETRIC PANEL, INCLUDING HIV
Antibody Screen: NEGATIVE
Basophils Absolute: 0.1 10*3/uL (ref 0.0–0.2)
Basos: 1 %
EOS (ABSOLUTE): 0.1 10*3/uL (ref 0.0–0.4)
Eos: 2 %
HIV Screen 4th Generation wRfx: NONREACTIVE
Hematocrit: 40.2 % (ref 34.0–46.6)
Hemoglobin: 13.9 g/dL (ref 11.1–15.9)
Hepatitis B Surface Ag: NEGATIVE
Immature Grans (Abs): 0.1 10*3/uL (ref 0.0–0.1)
Immature Granulocytes: 1 %
Lymphocytes Absolute: 2.5 10*3/uL (ref 0.7–3.1)
Lymphs: 28 %
MCH: 33 pg (ref 26.6–33.0)
MCHC: 34.6 g/dL (ref 31.5–35.7)
MCV: 96 fL (ref 79–97)
Monocytes Absolute: 0.6 10*3/uL (ref 0.1–0.9)
Monocytes: 7 %
Neutrophils Absolute: 5.5 10*3/uL (ref 1.4–7.0)
Neutrophils: 61 %
Platelets: 269 10*3/uL (ref 150–450)
RBC: 4.21 x10E6/uL (ref 3.77–5.28)
RDW: 13 % (ref 11.7–15.4)
RPR Ser Ql: NONREACTIVE
Rh Factor: POSITIVE
Rubella Antibodies, IGG: 4.38 index (ref >0.99)
WBC: 8.9 10*3/uL (ref 3.4–10.8)

## 2018-08-28 LAB — HEMOGLOBIN A1C
Est. average glucose Bld gHb Est-mCnc: 108 mg/dL
Hgb A1c MFr Bld: 5.4 % (ref 4.8–5.6)

## 2018-08-29 LAB — CYTOLOGY - PAP
Diagnosis: NEGATIVE
HPV: NOT DETECTED

## 2018-08-29 LAB — GC/CHLAMYDIA PROBE AMP (~~LOC~~) NOT AT ARMC
Chlamydia: NEGATIVE
Neisseria Gonorrhea: POSITIVE — AB

## 2018-08-30 LAB — URINE CULTURE, OB REFLEX

## 2018-08-30 LAB — CULTURE, OB URINE

## 2018-09-01 ENCOUNTER — Telehealth: Payer: Self-pay | Admitting: *Deleted

## 2018-09-01 MED ORDER — METRONIDAZOLE 500 MG PO TABS: 500.0000 mg | ORAL_TABLET | Freq: Two times a day (BID) | ORAL | 0 refills | Status: DC

## 2018-09-01 MED ORDER — CEFTRIAXONE SODIUM 250 MG IJ SOLR
250.0000 mg | Freq: Once | INTRAMUSCULAR | Status: AC
  Administered 2018-09-02: 15:00:00 250 mg via INTRAMUSCULAR

## 2018-09-01 MED ORDER — AZITHROMYCIN 500 MG PO TABS: 1000.0000 mg | ORAL_TABLET | Freq: Once | ORAL | 1 refills | Status: AC

## 2018-09-01 MED ORDER — CEPHALEXIN 500 MG PO CAPS: 500.0000 mg | ORAL_CAPSULE | Freq: Four times a day (QID) | ORAL | 2 refills | Status: DC

## 2018-09-01 NOTE — Telephone Encounter (Signed)
Received a voice message from Caitlin Terrell this am stating she saw results on MyChart for 2 + tests.  She wants to know if she needs to come in to the office today or if prescriptions are being called in. She states she wants to take action today.

## 2018-09-01 NOTE — Addendum Note (Signed)
Addended by: Mora Bellman on: 09/01/2018 08:27 AM   Modules accepted: Orders

## 2018-09-01 NOTE — Addendum Note (Signed)
Addended by: Mora Bellman on: 09/01/2018 08:30 AM   Modules accepted: Orders

## 2018-09-01 NOTE — Telephone Encounter (Signed)
I called Caitlin Terrell and we discussed her results - she states she is aware she tested positive for Trich, Gonorrhea. I explained these are Sexually transmitted and her partner as well as her need to get treated. She states she can't get ahold of him but is trying to. I explained I will have registrars call or message her with an appointment to come to the office for a shot to treat the Gonorrhea and we usually give you pills with that to completely treat the gonorrhea but Dr.Constant has already sent rx for pills for trich gonorrhea to her pharmacy.  I also explained she should avoid any intimate contact until both her and partner treated for at least 2 weeks. She voices understanding. STD form completed for health dept.  Jayceon Troy,RN

## 2018-09-02 ENCOUNTER — Encounter: Payer: Self-pay | Admitting: General Practice

## 2018-09-02 ENCOUNTER — Other Ambulatory Visit: Payer: Self-pay | Admitting: General Practice

## 2018-09-02 ENCOUNTER — Other Ambulatory Visit: Payer: Self-pay

## 2018-09-02 ENCOUNTER — Encounter: Payer: Self-pay | Admitting: *Deleted

## 2018-09-02 ENCOUNTER — Ambulatory Visit (INDEPENDENT_AMBULATORY_CARE_PROVIDER_SITE_OTHER): Payer: Medicaid Other | Admitting: Emergency Medicine

## 2018-09-02 DIAGNOSIS — A64 Unspecified sexually transmitted disease: Secondary | ICD-10-CM | POA: Diagnosis not present

## 2018-09-02 DIAGNOSIS — Z3491 Encounter for supervision of normal pregnancy, unspecified, first trimester: Secondary | ICD-10-CM

## 2018-09-02 DIAGNOSIS — O98219 Gonorrhea complicating pregnancy, unspecified trimester: Secondary | ICD-10-CM | POA: Insufficient documentation

## 2018-09-02 MED ORDER — BLOOD PRESSURE KIT DEVI: 1.0000 | Freq: Once | 0 refills | Status: AC

## 2018-09-02 MED ORDER — BLOOD PRESSURE KIT DEVI: 1.0000 | Freq: Once | 0 refills | Status: DC

## 2018-09-02 NOTE — Progress Notes (Signed)
Pt here today for STD treatment. Rocephin 250 mg was administered IM. Pt tolerated well.    Loma Sousa, RN 09/02/18    2:50PM

## 2018-09-02 NOTE — Progress Notes (Signed)
Patient seen and assessed by nursing staff during this encounter. I have reviewed the chart and agree with the documentation and plan.  Kerry Hough, PA-C 09/02/2018 3:36 PM

## 2018-09-03 ENCOUNTER — Telehealth: Payer: Self-pay | Admitting: *Deleted

## 2018-09-03 MED ORDER — DOXYLAMINE-PYRIDOXINE 10-10 MG PO TBEC: DELAYED_RELEASE_TABLET | ORAL | 2 refills | Status: DC

## 2018-09-03 NOTE — Telephone Encounter (Signed)
Patient sent message to registrar for appointment for results. Informed nurse will call her. I called Caitlin Terrell and she states she has gotten the results and started all her antibiotics and gotten her shot yesterday for std's. She does not have a question. We discussed she should take all her antibiotics until they are gone. We discussed we usually do a Test of cure about 2 weeks after finishing antibiotics. She agreed to a nurse visit for self swab and ua for 9/8.  Linda,RN

## 2018-09-05 ENCOUNTER — Encounter: Payer: Self-pay | Admitting: General Practice

## 2018-09-09 ENCOUNTER — Encounter: Payer: Self-pay | Admitting: General Practice

## 2018-09-10 ENCOUNTER — Other Ambulatory Visit: Payer: Self-pay | Admitting: General Practice

## 2018-09-10 DIAGNOSIS — Z3492 Encounter for supervision of normal pregnancy, unspecified, second trimester: Secondary | ICD-10-CM

## 2018-09-10 MED ORDER — BLOOD PRESSURE KIT DEVI: 1.0000 | Freq: Once | 0 refills | Status: AC

## 2018-09-15 ENCOUNTER — Encounter: Payer: Self-pay | Admitting: *Deleted

## 2018-09-15 ENCOUNTER — Telehealth: Payer: Self-pay | Admitting: *Deleted

## 2018-09-15 NOTE — Telephone Encounter (Signed)
Received [facility] results. Need to notify patient of results and offer free Genetic Counseling thru [employer]. She may call [phone] to schedule.

## 2018-09-16 ENCOUNTER — Encounter: Payer: Self-pay | Admitting: Obstetrics and Gynecology

## 2018-09-16 ENCOUNTER — Other Ambulatory Visit: Payer: Self-pay | Admitting: Lactation Services

## 2018-09-16 DIAGNOSIS — Z141 Cystic fibrosis carrier: Secondary | ICD-10-CM | POA: Insufficient documentation

## 2018-09-16 DIAGNOSIS — B379 Candidiasis, unspecified: Secondary | ICD-10-CM

## 2018-09-16 DIAGNOSIS — O09899 Supervision of other high risk pregnancies, unspecified trimester: Secondary | ICD-10-CM | POA: Insufficient documentation

## 2018-09-16 MED ORDER — TERCONAZOLE 0.4 % VA CREA: 1.0000 | TOPICAL_CREAM | Freq: Every day | VAGINAL | 0 refills | Status: DC

## 2018-09-16 NOTE — Progress Notes (Signed)
Terconazole per Obstetric Protocol for s/s vaginal yeast infection.

## 2018-09-17 MED ORDER — FLUCONAZOLE 150 MG PO TABS
150.0000 mg | ORAL_TABLET | Freq: Once | ORAL | 0 refills | Status: DC
Start: 2018-09-17 — End: 2018-11-20

## 2018-09-17 NOTE — Telephone Encounter (Signed)
Called patient & informed her of results and provided phone number to [facility] to schedule genetic counseling. Patient verbalized understanding & had no questions.

## 2018-09-23 ENCOUNTER — Encounter: Payer: Self-pay | Admitting: *Deleted

## 2018-09-24 ENCOUNTER — Telehealth: Payer: Self-pay | Admitting: Obstetrics & Gynecology

## 2018-09-24 ENCOUNTER — Telehealth: Payer: Self-pay | Admitting: Medical

## 2018-09-24 NOTE — Telephone Encounter (Signed)
Called the patient to inform of the upcoming appointment. Received a message stating the person you have called can not receive calls at this time.

## 2018-09-25 ENCOUNTER — Ambulatory Visit: Payer: Medicaid Other

## 2018-09-25 ENCOUNTER — Other Ambulatory Visit (HOSPITAL_COMMUNITY)
Admission: RE | Admit: 2018-09-25 | Discharge: 2018-09-25 | Disposition: A | Discharge status: Home / Self Care | Payer: Medicaid Other | Source: Ambulatory Visit | Attending: Family Medicine | Admitting: Family Medicine

## 2018-09-25 ENCOUNTER — Ambulatory Visit (INDEPENDENT_AMBULATORY_CARE_PROVIDER_SITE_OTHER): Payer: Medicaid Other | Admitting: General Practice

## 2018-09-25 ENCOUNTER — Other Ambulatory Visit: Payer: Self-pay

## 2018-09-25 DIAGNOSIS — Z113 Encounter for screening for infections with a predominantly sexual mode of transmission: Secondary | ICD-10-CM | POA: Diagnosis not present

## 2018-09-25 DIAGNOSIS — O2342 Unspecified infection of urinary tract in pregnancy, second trimester: Secondary | ICD-10-CM | POA: Diagnosis not present

## 2018-09-25 LAB — POCT URINALYSIS DIP (DEVICE)
Bilirubin Urine: NEGATIVE
Glucose, UA: NEGATIVE mg/dL
Ketones, ur: NEGATIVE mg/dL
Leukocytes,Ua: NEGATIVE
Nitrite: NEGATIVE
Protein, ur: NEGATIVE mg/dL
Specific Gravity, Urine: >=1.03 (ref 1.005–1.030)
Urobilinogen, UA: 0.2 mg/dL (ref 0.0–1.0)
pH: 6 (ref 5.0–8.0)

## 2018-09-25 NOTE — Progress Notes (Signed)
Patient presents to office today for test of cure following recent + trichomonas & gonorrhea. She also had a UTI recently as well. UA performed today revealing trace blood- will send for culture per Dr Ilda Basset. Patient instructed in self swab & specimen collected. Patient also requests testing for BV and yeast. Discussed results will be available via mychart & we will reach out to her if treatment is needed. Patient verbalized understanding and had no questions.  Koren Bound RN BSN 09/25/18

## 2018-09-26 ENCOUNTER — Encounter: Payer: Self-pay | Admitting: *Deleted

## 2018-09-27 LAB — CERVICOVAGINAL ANCILLARY ONLY
Bacterial vaginitis: NEGATIVE
Candida vaginitis: NEGATIVE
Chlamydia: NEGATIVE
Neisseria Gonorrhea: NEGATIVE
Trichomonas: NEGATIVE

## 2018-09-27 LAB — URINE CULTURE, OB REFLEX: Organism ID, Bacteria: NO GROWTH

## 2018-09-27 LAB — CULTURE, OB URINE

## 2018-09-29 ENCOUNTER — Telehealth (INDEPENDENT_AMBULATORY_CARE_PROVIDER_SITE_OTHER): Payer: Medicaid Other | Admitting: Advanced Practice Midwife

## 2018-09-29 ENCOUNTER — Encounter: Payer: Self-pay | Admitting: Advanced Practice Midwife

## 2018-09-29 ENCOUNTER — Other Ambulatory Visit: Payer: Self-pay

## 2018-09-29 DIAGNOSIS — O98212 Gonorrhea complicating pregnancy, second trimester: Secondary | ICD-10-CM

## 2018-09-29 DIAGNOSIS — O09899 Supervision of other high risk pregnancies, unspecified trimester: Secondary | ICD-10-CM

## 2018-09-29 DIAGNOSIS — O09892 Supervision of other high risk pregnancies, second trimester: Secondary | ICD-10-CM | POA: Diagnosis not present

## 2018-09-29 DIAGNOSIS — O99342 Other mental disorders complicating pregnancy, second trimester: Secondary | ICD-10-CM | POA: Diagnosis not present

## 2018-09-29 DIAGNOSIS — Z3492 Encounter for supervision of normal pregnancy, unspecified, second trimester: Secondary | ICD-10-CM

## 2018-09-29 DIAGNOSIS — Z3A17 17 weeks gestation of pregnancy: Secondary | ICD-10-CM | POA: Diagnosis not present

## 2018-09-29 DIAGNOSIS — F319 Bipolar disorder, unspecified: Secondary | ICD-10-CM

## 2018-09-29 DIAGNOSIS — O98219 Gonorrhea complicating pregnancy, unspecified trimester: Secondary | ICD-10-CM

## 2018-09-29 NOTE — Progress Notes (Signed)
Patient seen and assessed by nursing staff during this encounter. I have reviewed the chart and agree with the documentation and plan.  Aletha Halim, MD 09/29/2018 10:25 AM

## 2018-09-29 NOTE — Progress Notes (Signed)
   TELEHEALTH VIRTUAL OBSTETRICS VISIT ENCOUNTER NOTE  I connected with Caitlin Terrell on 09/29/18 at  3:55 PM EDT by telephone at home and verified that I am speaking with the correct person using two identifiers.   I discussed the limitations, risks, security and privacy concerns of performing an evaluation and management service by telephone and the availability of in person appointments. I also discussed with the patient that there may be a patient responsible charge related to this service. The patient expressed understanding and agreed to proceed.  Subjective:  Caitlin Terrell is a 33 y.o. G2P1001 at [redacted]w[redacted]d being followed for ongoing prenatal care.  She is currently monitored for the following issues for this low-risk pregnancy and has Supervision of low-risk pregnancy; Bipolar disease in pregnancy ([place]); Asthma; Gonorrhea in pregnancy, antepartum; and Cystic fibrosis carrier, antepartum on their problem list.  Patient reports no complaints. Reports fetal movement. Denies any contractions, bleeding or leaking of fluid.   The following portions of the patient's history were reviewed and updated as appropriate: allergies, current medications, past family history, past medical history, past social history, past surgical history and problem list.   Objective:   General:  Alert, oriented and cooperative.   Mental Status: Normal mood and affect perceived. Normal judgment and thought content.  Rest of physical exam deferred due to type of encounter  Assessment and Plan:  Pregnancy: G2P1001 at [redacted]w[redacted]d 1. Cystic fibrosis carrier, antepartum  2. Gonorrhea in pregnancy, antepartum - TOC negative  3. Encounter for supervision of low-risk pregnancy in second trimester - AFP, Serum, Open Spina Bifida; Future - Will get AFP when she is here for ultrasound visit - Patient also needs intermittent FMLA papers completed. Will drop them off here.    Preterm labor symptoms and general obstetric  precautions including but not limited to vaginal bleeding, contractions, leaking of fluid and fetal movement were reviewed in detail with the patient.  I discussed the assessment and treatment plan with the patient. The patient was provided an opportunity to ask questions and all were answered. The patient agreed with the plan and demonstrated an understanding of the instructions. The patient was advised to call back or seek an in-person office evaluation/go to [facility] at [facility] for any urgent or concerning symptoms. Please refer to After Visit Summary for other counseling recommendations.   I provided 15 minutes of non-face-to-face time during this encounter.  Return in about 4 weeks (around 10/27/2018) for in person visit .  Future Appointments  Date Time Provider [place]  10/15/2018  8:00 AM WH-MFC [place] 3 WH-MFCUS MFC-US   Deon Ivey DNP, CNM  09/29/18  [place] [facility]

## 2018-09-29 NOTE — Progress Notes (Signed)
I connected with  Caitlin Terrell on 09/29/18 at  3:55 PM EDT by telephone and verified that I am speaking with the correct person using two identifiers.   I discussed the limitations, risks, security and privacy concerns of performing an evaluation and management service by telephone and the availability of in person appointments. I also discussed with the patient that there may be a patient responsible charge related to this service. The patient expressed understanding and agreed to proceed.  Alric Seton, [place] 09/29/2018  3:53 PM

## 2018-09-30 ENCOUNTER — Telehealth: Payer: Self-pay | Admitting: *Deleted

## 2018-09-30 ENCOUNTER — Other Ambulatory Visit: Payer: Self-pay | Admitting: Advanced Practice Midwife

## 2018-09-30 MED ORDER — CETIRIZINE HCL 10 MG PO TABS: 10.0000 mg | ORAL_TABLET | Freq: Every day | ORAL | 11 refills | Status: DC

## 2018-09-30 NOTE — Progress Notes (Signed)
RX for zyrtec sent to patient's pharmacy.   Marcille Buffy DNP, CNM  09/30/18  9:17 AM

## 2018-09-30 NOTE — Telephone Encounter (Signed)
Pt left message stating that she is calling for 2 reasons. First, she was informed by her job that [employer] had been faxed to our office and she wants to verify that we have received them. She stated that they must be completed by 9/25. Second, she stated that she had discussed her sinuses and allergy problems during her visit yesterday. She thought that a prescription would be sent to her pharmacy however none has been received. Per chart review @ 1230, Rx for Cetirizine 10 mg was e-prescribed by Marcille Buffy, CNM @ [phone].

## 2018-10-01 NOTE — Telephone Encounter (Signed)
Addressed in mychart encounter

## 2018-10-02 ENCOUNTER — Ambulatory Visit: Payer: Medicaid Other

## 2018-10-14 ENCOUNTER — Telehealth: Payer: Self-pay | Admitting: Family Medicine

## 2018-10-14 NOTE — Telephone Encounter (Signed)
Spoke to patient about her appointment on 9/30 @ 9:10. Patient instructed to wear a face mask for the entire appointment and no visitors are allowed with her during the visit. Patient screened for covid symptoms and denied having any.

## 2018-10-15 ENCOUNTER — Other Ambulatory Visit: Payer: Self-pay | Admitting: Obstetrics and Gynecology

## 2018-10-15 ENCOUNTER — Other Ambulatory Visit: Payer: Medicaid Other

## 2018-10-15 ENCOUNTER — Other Ambulatory Visit (HOSPITAL_COMMUNITY): Payer: Self-pay | Admitting: *Deleted

## 2018-10-15 ENCOUNTER — Other Ambulatory Visit: Payer: Self-pay

## 2018-10-15 ENCOUNTER — Ambulatory Visit (HOSPITAL_COMMUNITY)
Admission: RE | Admit: 2018-10-15 | Discharge: 2018-10-15 | Disposition: A | Discharge status: Home / Self Care | Payer: Medicaid Other | Source: Ambulatory Visit | Attending: Obstetrics and Gynecology | Admitting: Obstetrics and Gynecology

## 2018-10-15 DIAGNOSIS — O09892 Supervision of other high risk pregnancies, second trimester: Secondary | ICD-10-CM

## 2018-10-15 DIAGNOSIS — Z3490 Encounter for supervision of normal pregnancy, unspecified, unspecified trimester: Secondary | ICD-10-CM | POA: Diagnosis present

## 2018-10-15 DIAGNOSIS — Z362 Encounter for other antenatal screening follow-up: Secondary | ICD-10-CM

## 2018-10-15 DIAGNOSIS — Z3A19 19 weeks gestation of pregnancy: Secondary | ICD-10-CM

## 2018-10-15 DIAGNOSIS — Z3492 Encounter for supervision of normal pregnancy, unspecified, second trimester: Secondary | ICD-10-CM

## 2018-10-18 LAB — AFP, SERUM, OPEN SPINA BIFIDA
AFP MoM: 0.95
AFP Value: 48.8 ng/mL
Gest. Age on Collection Date: 19.6 weeks
Maternal Age At EDD: 31.6 yr
OSBR Risk 1 IN: 10000
Test Results:: NEGATIVE
Weight: 187 [lb_av]

## 2018-10-23 ENCOUNTER — Encounter: Payer: Self-pay | Admitting: *Deleted

## 2018-10-27 ENCOUNTER — Encounter: Payer: Medicaid Other | Admitting: Student

## 2018-11-04 ENCOUNTER — Other Ambulatory Visit: Payer: Self-pay | Admitting: Lactation Services

## 2018-11-04 DIAGNOSIS — Z029 Encounter for administrative examinations, unspecified: Secondary | ICD-10-CM

## 2018-11-04 MED ORDER — TERCONAZOLE 0.4 % VA CREA: 1.0000 | TOPICAL_CREAM | Freq: Every day | VAGINAL | 0 refills | Status: DC

## 2018-11-04 NOTE — Progress Notes (Signed)
Terconazole ordered for s/s vaginal yeast per Obstetric Standing Protocol.

## 2018-11-05 ENCOUNTER — Other Ambulatory Visit: Payer: Self-pay | Admitting: Obstetrics & Gynecology

## 2018-11-05 DIAGNOSIS — B379 Candidiasis, unspecified: Secondary | ICD-10-CM

## 2018-11-11 NOTE — Progress Notes (Signed)
Subjective:  Caitlin Terrell is a 33 y.o. G2P1001 at [redacted]w[redacted]d being seen today for ongoing prenatal care.  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  Patient reports no complaints.  Contractions: Not present. Vag. Bleeding: None.  Movement: Present. Denies leaking of fluid.   The following portions of the patient's history were reviewed and updated as appropriate: allergies, current medications, past family history, past medical history, past social history, past surgical history and problem list. Problem list updated.  Objective:   Vitals:   11/12/18 0948  BP: 120/79  Pulse: 97  Weight: 193 lb (87.5 kg)    Fetal Status: Fetal Heart Rate (bpm): 150   Movement: Present     General:  Alert, oriented and cooperative. Patient is in no acute distress.  Skin: Skin is warm and dry. No rash noted.   Cardiovascular: Normal heart rate noted  Respiratory: Normal respiratory effort, no problems with respiration noted  Abdomen: Soft, gravid, appropriate for gestational age. Pain/Pressure: Present     Pelvic: Vag. Bleeding: None     Cervical exam deferred        Extremities: Normal range of motion.  Edema: None  Mental Status: Normal mood and affect. Normal behavior. Normal judgment and thought content.    Assessment and Plan:  Pregnancy: G2P1001 at [redacted]w[redacted]d  1. Encounter for supervision of low-risk pregnancy in second trimester - pt declines flu vaccine today - discussed breast vs. bottle feeding, pt elects breastfeeding - RPR/HIV/CBC/2hr glucose entered as future orders, pt aware to come fasting to next visit - pt reports support person will be her sister or partner, discussed current visitor policy - fetal anatomy f/u scan  today, echogenic bowel identified, MFM to draw labs and repeat US in 4-5weeks  2. Bipolar disease during pregnancy in second trimester ([place]) - no meds, pt reports she feels well and requests referral to [facility] today - ambulatory referral to behavioral health  3. Mild intermittent asthma without complication -pt denies recent use of inhaler and states that she has not experienced any exacerbations since her last visit -pt reports she has a rescue inhaler at home, as well as a nebulizer  4. Cystic fibrosis carrier, antepartum - FOB screening for CF not done, pt reports they are not speaking at this time  Preterm labor symptoms and general obstetric precautions including but not limited to vaginal bleeding, contractions, leaking of fluid and fetal movement were reviewed in detail with the patient. Please refer to After Visit Summary for other counseling recommendations.  Return in about 2 weeks (around 11/26/2018) for virtual HOB 2 wks, needs appointment with behavioral health ASAP, in-person [facility] 4wks for 28wk labs.  [place], Caitlin Nordmann, NP

## 2018-11-12 ENCOUNTER — Other Ambulatory Visit: Payer: Self-pay

## 2018-11-12 ENCOUNTER — Ambulatory Visit (HOSPITAL_COMMUNITY): Payer: Medicaid Other

## 2018-11-12 ENCOUNTER — Ambulatory Visit (INDEPENDENT_AMBULATORY_CARE_PROVIDER_SITE_OTHER): Payer: Medicaid Other | Admitting: Women's Health

## 2018-11-12 ENCOUNTER — Other Ambulatory Visit (HOSPITAL_COMMUNITY): Payer: Self-pay | Admitting: *Deleted

## 2018-11-12 ENCOUNTER — Ambulatory Visit (HOSPITAL_COMMUNITY)
Admission: RE | Admit: 2018-11-12 | Discharge: 2018-11-12 | Disposition: A | Discharge status: Home / Self Care | Payer: Medicaid Other | Source: Ambulatory Visit | Attending: Obstetrics and Gynecology | Admitting: Obstetrics and Gynecology

## 2018-11-12 VITALS — BP 120/79 | HR 97 | Wt 193.0 lb

## 2018-11-12 DIAGNOSIS — O283 Abnormal ultrasonic finding on antenatal screening of mother: Secondary | ICD-10-CM | POA: Insufficient documentation

## 2018-11-12 DIAGNOSIS — Z3A23 23 weeks gestation of pregnancy: Secondary | ICD-10-CM

## 2018-11-12 DIAGNOSIS — O09892 Supervision of other high risk pregnancies, second trimester: Secondary | ICD-10-CM

## 2018-11-12 DIAGNOSIS — Z3492 Encounter for supervision of normal pregnancy, unspecified, second trimester: Secondary | ICD-10-CM

## 2018-11-12 DIAGNOSIS — F319 Bipolar disorder, unspecified: Secondary | ICD-10-CM

## 2018-11-12 DIAGNOSIS — Z362 Encounter for other antenatal screening follow-up: Secondary | ICD-10-CM

## 2018-11-12 DIAGNOSIS — O358XX0 Maternal care for other (suspected) fetal abnormality and damage, not applicable or unspecified: Secondary | ICD-10-CM | POA: Insufficient documentation

## 2018-11-12 DIAGNOSIS — IMO0002 Reserved for concepts with insufficient information to code with codable children: Secondary | ICD-10-CM

## 2018-11-12 DIAGNOSIS — J4520 Mild intermittent asthma, uncomplicated: Secondary | ICD-10-CM

## 2018-11-12 DIAGNOSIS — O09899 Supervision of other high risk pregnancies, unspecified trimester: Secondary | ICD-10-CM

## 2018-11-12 DIAGNOSIS — O99342 Other mental disorders complicating pregnancy, second trimester: Secondary | ICD-10-CM

## 2018-11-12 NOTE — Consult Note (Signed)
MFM consult note  I met with with Caitlin Terrell during today's fetal ultrasound that revealed an echogenic bowel. She is a known cystic fibrosis carrier, testing on Caitlin partner his not performed give the relationship strain.  Please see ultrasound report for further details.  Secondly we observed an echogenic bowel in the left lower portion of the abdomen. I reviewed today's images with Caitlin Terrell including images with and without harmonics. We discussed the differential diagnosis of echogenic bowel including, normal variant, aneuploidy, cystic fibroisis, infection, and swallowed intra-amniotic bleeding. 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 She is not in contact with Caitlin Terrell carrier risk screening was not performed on the father of the baby.   We discussed the implications of echogenic bowel including the evaluation and managment. We discussed Caitlin non-invasive vs invasive testing including amniocentesis we discussed the 1:500-1:1000 risk of pregnancy loss. We discussed the encouraging nature of the low risk cell free in reducing the risk for aneuploidy related echogenic bowel. At this she has declined aminocentesis but would accept serum TORCH titers to be drawn. She also opted to have a repeat growth. We discussed the value of amniocenesis with regards to cystic fibrosis evaluation as well give the strained relastionship with Caitlin partner, she declined at this time.  Follow up growth in 4 weeks with genetic counseling.  I spent 30 minutes with >50% in face to face consultation and care coordination.  Vikki Ports, MD

## 2018-11-12 NOTE — Patient Instructions (Addendum)
Pregnancy and Toxoplasmosis Toxoplasmosis is an infection that is caused by a parasite. Usually, there are no symptoms and the body can fight off the infection. If you get toxoplasmosis during pregnancy, there is a chance that the infection will spread to your baby. If this happens, your baby may develop serious health problems, such as blindness, intellectual disabilities, and other neurological disorders. Some of these problems may not show up for years. How do people get toxoplasmosis? You can get toxoplasmosis if:  You touch anything that is contaminated with infected cat feces and then you touch your mouth.  You eat raw or undercooked meat from an infected animal.  You eat fruits and vegetables that were grown in contaminated soil. Your baby can get toxoplasmosis through your blood supply if you are infected during pregnancy or just before pregnancy. How can I protect myself and my baby against toxoplasmosis?  Do not get a new cat.  Do not touch stray cats.  If you have a sandbox, cover it when it is not being used.  Avoid working in soil where cats may leave feces.  Wear gloves when you work in the soil. Wash your hands with soap and water when you are finished.  If you have a cat: ? Have someone else change the cat's litter box daily. He or she should wash his or her hands afterward. ? Do not let your cat outside. ? Do not feed your cat any raw meat.  Do not eat undercooked meat, especially meat that has never been frozen.  Wash and peel all fruits and vegetables before eating them.  Avoid drinking untreated water.  If you have toxoplasmosis and you are not pregnant, wait at least 6 months before becoming pregnant. How do I know if I have toxoplasmosis? The only way to know for sure that you have toxoplasmosis is with a test. People with toxoplasmosis do not always have symptoms. If symptoms are present, they may include:  A fever.  Swollen glands.  Muscle  aches.  Headaches.  Feeling like you have a cold or the flu. If you think you have toxoplasmosis, or if you think that you may have been exposed to it, call your health care provider. How is toxoplasmosis diagnosed? When you become pregnant, your health care provider may order a blood test to check whether you have ever had toxoplasmosis.  If you have had toxoplasmosis infection before, you cannot get it again.  If you have never had toxoplasmosis, your health care provider may repeat this test at a later date.  If you become infected during pregnancy, your health care provider may do more tests to find out whether the infection has spread to the baby.  Other tests may include an ultrasound and a test of your amniotic fluid (amniocentesis). How is toxoplasmosis treated? Toxoplasmosis may be treated with antibiotics and other medicines.  Some of these medicines can lower your baby's chance of developing complications later on.  Medicines may need to be taken for up to one year. What should I do at home if I am diagnosed with toxoplasmosis?  Take over-the-counter and prescription medicines only as told by your health care provider.  If you were prescribed an antibiotic medicine, take it as told by your health care provider. Do not stop taking the antibiotic even if you start to feel better.  Keep all follow-up visits as told by your health care provider. This is important. This information is not intended to replace advice given to  you by your health care provider. Make sure you discuss any questions you have with your health care provider. Document Released: 04/09/2000 Document Revised: 04/25/2018 Document Reviewed: 08/07/2015 Elsevier Patient Education  2020 [employer] Pregnancy and Parvovirus Infection A parvovirus infection, also called fifth disease, is an illness that is caused by a virus. The virus is contagious and spreads to others by:  The droplets that are sprayed into  the air when an infected person talks, coughs, and sneezes.  Touching infected saliva or mucus of an ill child. Many pregnant women have had the infection prior to pregnancy, so they develop resistance (immunity) and cannot become infected with the virus again. How does this affect me? Most pregnant women who get parvovirus have only a mild illness from it. In some cases, this condition may not cause any symptoms. However, if you do develop symptoms, you may have:  Tiredness.  Sore throat.  Runny nose.  Cough.  Shortness of breath.  Low-grade fever.  Upset stomach. Several days into the illness, you may develop:  A bright red rash on both cheeks. This is sometimes called a slapped-cheek rash.  A pink, lacy rash on the body, arms, and legs. This rash may come and go for up to 5 weeks. It may get brighter after you take a warm bath, exercise, or are out in the sun.  Stiffness and pain in the joints. Usually, the joints in the hands, wrists, and ankles are the ones that are affected. This symptom may also last for weeks. How does this affect my baby? Most of the time, babies are not affected when a mother has parvovirus. If you develop this illness during the first half of your pregnancy, there is a small chance that the virus may pass to your unborn baby and cause serious problems. Problems may include:  A low number of red blood cells (anemia).  A condition that causes swelling in your unborn baby (hydrops fetalis).  In rare cases, fetal death. How is this treated? Usually, no treatment is necessary if you are in good health. Your health care provider may suggest that you take over-the-counter medicine for your symptoms. Pregnant women who develop parvovirus may need blood tests or ultrasound exams to monitor how the infection is affecting the developing baby. People with severe anemia may require a blood transfusion. A person with an immune disorder may need to receive  injections of antibodies (immune globulin). If your unborn baby develops severe anemia, the baby can receive a blood transfusion before being born (in utero), or you may be given medicines to combat the infection. Follow these instructions at home:   Take over-the-counter and prescription medicines only as told by your health care provider.  Drink enough fluid to keep your urine pale yellow.  Take steps to prevent the spread of the disease while it is still contagious. A parvovirus infection is contagious for about 5-10 days before the rash develops. While it is contagious, keep it from spreading to others by: ? Avoiding close contact with others. ? Washing your hands often with soap and water. If soap and water are not available, use hand sanitizer. Contact a health care provider if:  Your rash becomes itchy and bothersome.  You believe you have been exposed to parvovirus. Get help right away if:  You seem to be getting worse.  You develop new symptoms.  You have severe joint pain, swelling, or stiffness. Summary  A parvovirus infection, also called fifth disease, is an  illness that is caused by a virus.  Most pregnant women who get parvovirus have only a mild illness from it, and their babies do not have any problems caused by the infection.  If you believe you have been exposed to parvovirus during pregnancy, seek care from your health care provider. This information is not intended to replace advice given to you by your health care provider. Make sure you discuss any questions you have with your health care provider. Document Released: 01/01/2005 Document Revised: 04/25/2018 Document Reviewed: 02/06/2017 Elsevier Patient Education  2020 Else[employer] Inc. Pregnancy and Cytomegalovirus Cytomegalovirus (CMV) is a common virus that affects people of all ages. Being infected with CMV causes few problems or no problems in people who have a healthy immune system. However, if you are  pregnant, CMV infection is a concern because you can pass the virus to your baby before, during, or after birth. If you become infected with CMV for the first time while you are pregnant, the virus can cause serious problems for your baby. If you had CMV before your pregnancy, the virus does not affect the infant as seriously. Infants are at risk for developing hearing, vision, neurological, and developmental problems over time. When a person is infected with CMV, he or she remains infected forever. What are the causes? This condition is caused by coming in contact with the bodily fluids of an infected person, such as:  Saliva.  Blood.  Urine.  Tears.  Semen.  Vaginal fluids.  Stool.  Blood transfusions.  Organ transplants. In pregnant women, CMV may be passed to the fetus:  Through the placenta.  During birth.  Through infected breast milk. What are the signs or symptoms? Usually, there are no symptoms for this condition. When symptoms develop, they may include:  Tiredness and weakness.  Fever and chills.  Sore throat and swollen lymph glands.  Night sweats.  Muscle aches and pain. Most infants who were infected before birth usually show no symptoms and do not have long-term health problems. However, for some babies, CMV infection can cause birth defects or other health problems that may include:  Yellowing of the skin and the white parts of the eyes (jaundice).  Small purple-colored spots on the skin.  Small size at birth.  An enlarged liver or spleen.  A small head.  Seizures.  Rash.  Feeding difficulties.  Brain calcifications.  Growth delay. How is this diagnosed? This condition may be diagnosed based on the test results of:  A blood sample.  A culture of body fluids.  A sample of body tissue (biopsy).  Fluid from the pregnancy sac (amniotic fluid or bag of water).  CMV test on theinfant after birth. How is this treated? There is no  cure for this condition. However:  Treatment may not be needed for healthy children and adults.  Antiviral medicines may be given slow down the progress of the infection.  Treatment can be focused on relieving the symptoms, such as fever, muscle pain, tiredness, and weakness. Follow these instructions at home: Preventing infection   Follow instructions and advice from your health care provider about how to reduce risks.  Wash your hands often with soap and warm water, especially if you have contact with children or diapers. If soap and water are not available, use hand sanitizer.  Avoid kissing on the mouth, especially children.  Do not let your tears touch another person.  Do not breastfeed if you have CMV infection.  Do not share drinks  or eating utensils with others.  Practice safe sex and use condoms during sexual intercourse. General instructions  Take over-the-counter and prescription medicines only as told by your health care provider.  Keep all follow-up visits as told by your health care provider. This is important. Contact a health care provider if:  You develop any of the symptoms of CMV infection.  You think your infant has CMV infection. This information is not intended to replace advice given to you by your health care provider. Make sure you discuss any questions you have with your health care provider. Document Released: 12/30/1999 Document Revised: 04/25/2018 Document Reviewed: 08/08/2015 Elsevier Patient Education  2020 [place], [place] Why am I having this test? TORCH is a blood test that is used to check for several infectious diseases that can cause illness in pregnant women and may cause birth defects in babies. TORCH stands for the different infections the test screens for, which are:  Toxoplasmosis.  Other infections, such as hepatitis B, HIV (human immunodeficiency virus), human parvovirus, enterovirus, Epstein-Barr virus,  syphilis, parvovirus B19, or chicken pox (varicella-zoster virus).  Rubella.  Cytomegalovirus (CMV).  Herpes simplex virus (HSV). You may have the TORCH test done if you are a pregnant woman and your health care provider thinks that you might have one of these infections. What is being tested? The TORCH test checks for antibodies to these infections. Antibodies are a type of cell that is part of the body's disease-fighting (immune) system. After you get an infection, your body makes antibodies that stay in your body after you recover and protect you from getting the same infection again. The TORCH test checks for two types of antibodies:  Immunoglobulin M (IgM).  Immunoglobulin G (IgG). What kind of sample is taken?  A blood sample is required for this test. It is usually collected by inserting a needle into a blood vessel. How are the results reported? Results will be reported as either positive or negative for each type of antibody and will be specific to each infectious disease that you are being tested for. A false-positive result can occur. A false positive is incorrect because it means that the test is showing that a condition is present when it is not. All positive TORCH tests should be followed up with more specific tests to confirm. What do the results mean? Possible results include:  Negative for IgM and IgG--Infection is unlikely. You may have more testing to confirm the results.  Negative for IgM and positive for IgG--You may have had a previous infection that most likely did not pass to your baby. You may have a second TORCH test in a few weeks to compare levels.  Positive for IgM--You may have a current infection, or you may have had a recent infection with one or more of the TORCH infections. You may need more tests to determine the exact type of infection. Talk with your health care provider about what your results mean. Questions to ask your health care provider Ask  your health care provider, or the department that is doing the test:  When will my results be ready?  How will I get my results?  What are my treatment options?  What other tests do I need?  What are my next steps? Summary  TORCH is a blood test used to check for several infectious diseases that can cause illness in pregnant women and may cause birth defects in babies.  The [facility] test checks your blood for  antibodies to the TORCH infections.  The results of the test will be reported as either positive or negative for each type of antibody and will be specific to each infectious disease that you are being tested for.  Talk with your health care provider about what your results mean. This information is not intended to replace advice given to you by your health care provider. Make sure you discuss any questions you have with your health care provider. Document Released: 01/19/2004 Document Revised: 08/29/2016 Document Reviewed: 08/29/2016 Elsevier Patient Education  2020 [employer]. Second Trimester of Pregnancy The second trimester is from week 14 through week 27 (months 4 through 6). The second trimester is often a time when you feel your best. Your body has adjusted to being pregnant, and you begin to feel better physically. Usually, morning sickness has lessened or quit completely, you may have more energy, and you may have an increase in appetite. The second trimester is also a time when the fetus is growing rapidly. At the end of the sixth month, the fetus is about 9 inches long and weighs about 1 pounds. You will likely begin to feel the baby move (quickening) between 16 and 20 weeks of pregnancy. Body changes during your second trimester Your body continues to go through many changes during your second trimester. The changes vary from woman to woman.  Your weight will continue to increase. You will notice your lower abdomen bulging out.  You may begin to get stretch marks on  your hips, abdomen, and breasts.  You may develop headaches that can be relieved by medicines. The medicines should be approved by your health care provider.  You may urinate more often because the fetus is pressing on your bladder.  You may develop or continue to have heartburn as a result of your pregnancy.  You may develop constipation because certain hormones are causing the muscles that push waste through your intestines to slow down.  You may develop hemorrhoids or swollen, bulging veins (varicose veins).  You may have back pain. This is caused by: ? Weight gain. ? Pregnancy hormones that are relaxing the joints in your pelvis. ? A shift in weight and the muscles that support your balance.  Your breasts will continue to grow and they will continue to become tender.  Your gums may bleed and may be sensitive to brushing and flossing.  Dark spots or blotches (chloasma, mask of pregnancy) may develop on your face. This will likely fade after the baby is born.  A dark line from your belly button to the pubic area (linea nigra) may appear. This will likely fade after the baby is born.  You may have changes in your hair. These can include thickening of your hair, rapid growth, and changes in texture. Some women also have hair loss during or after pregnancy, or hair that feels dry or thin. Your hair will most likely return to normal after your baby is born. What to expect at prenatal visits During a routine prenatal visit:  You will be weighed to make sure you and the fetus are growing normally.  Your blood pressure will be taken.  Your abdomen will be measured to track your baby's growth.  The fetal heartbeat will be listened to.  Any test results from the previous visit will be discussed. Your health care provider may ask you:  How you are feeling.  If you are feeling the baby move.  If you have had any abnormal symptoms, such  as leaking fluid, bleeding, severe headaches,  or abdominal cramping.  If you are using any tobacco products, including cigarettes, chewing tobacco, and electronic cigarettes.  If you have any questions. Other tests that may be performed during your second trimester include:  Blood tests that check for: ? Low iron levels (anemia). ? High blood sugar that affects pregnant women (gestational diabetes) between 9124 and 28 weeks. ? Rh antibodies. This is to check for a protein on red blood cells (Rh factor).  Urine tests to check for infections, diabetes, or protein in the urine.  An ultrasound to confirm the proper growth and development of the baby.  An amniocentesis to check for possible genetic problems.  Fetal screens for spina bifida and Down syndrome.  HIV (human immunodeficiency virus) testing. Routine prenatal testing includes screening for HIV, unless you choose not to have this test. Follow these instructions at home: Medicines  Follow your health care provider's instructions regarding medicine use. Specific medicines may be either safe or unsafe to take during pregnancy.  Take a prenatal vitamin that contains at least 600 micrograms (mcg) of folic acid.  If you develop constipation, try taking a stool softener if your health care provider approves. Eating and drinking   Eat a balanced diet that includes fresh fruits and vegetables, whole grains, good sources of protein such as meat, eggs, or tofu, and low-fat dairy. Your health care provider will help you determine the amount of weight gain that is right for you.  Avoid raw meat and uncooked cheese. These carry germs that can cause birth defects in the baby.  If you have low calcium intake from food, talk to your health care provider about whether you should take a daily calcium supplement.  Limit foods that are high in fat and processed sugars, such as fried and sweet foods.  To prevent constipation: ? Drink enough fluid to keep your urine clear or pale  yellow. ? Eat foods that are high in fiber, such as fresh fruits and vegetables, whole grains, and beans. Activity  Exercise only as directed by your health care provider. Most women can continue their usual exercise routine during pregnancy. Try to exercise for 30 minutes at least 5 days a week. Stop exercising if you experience uterine contractions.  Avoid heavy lifting, wear low heel shoes, and practice good posture.  A sexual relationship may be continued unless your health care provider directs you otherwise. Relieving pain and discomfort  Wear a good support bra to prevent discomfort from breast tenderness.  Take warm sitz baths to soothe any pain or discomfort caused by hemorrhoids. Use hemorrhoid cream if your health care provider approves.  Rest with your legs elevated if you have leg cramps or low back pain.  If you develop varicose veins, wear support hose. Elevate your feet for 15 minutes, 3-4 times a day. Limit salt in your diet. Prenatal Care  Write down your questions. Take them to your prenatal visits.  Keep all your prenatal visits as told by your health care provider. This is important. Safety  Wear your seat belt at all times when driving.  Make a list of emergency phone numbers, including numbers for family, friends, the hospital, and police and fire departments. General instructions  Ask your health care provider for a referral to a local prenatal education class. Begin classes no later than the beginning of month 6 of your pregnancy.  Ask for help if you have counseling or nutritional needs during pregnancy. Your health  care provider can offer advice or refer you to specialists for help with various needs.  Do not use hot tubs, steam rooms, or saunas.  Do not douche or use tampons or scented sanitary pads.  Do not cross your legs for long periods of time.  Avoid cat litter boxes and soil used by cats. These carry germs that can cause birth defects in the  baby and possibly loss of the fetus by miscarriage or stillbirth.  Avoid all smoking, herbs, alcohol, and unprescribed drugs. Chemicals in these products can affect the formation and growth of the baby.  Do not use any products that contain nicotine or tobacco, such as cigarettes and e-cigarettes. If you need help quitting, ask your health care provider.  Visit your dentist if you have not gone yet during your pregnancy. Use a soft toothbrush to brush your teeth and be gentle when you floss. Contact a health care provider if:  You have dizziness.  You have mild pelvic cramps, pelvic pressure, or nagging pain in the abdominal area.  You have persistent nausea, vomiting, or diarrhea.  You have a bad smelling vaginal discharge.  You have pain when you urinate. Get help right away if:  You have a fever.  You are leaking fluid from your vagina.  You have spotting or bleeding from your vagina.  You have severe abdominal cramping or pain.  You have rapid weight gain or weight loss.  You have shortness of breath with chest pain.  You notice sudden or extreme swelling of your face, hands, ankles, feet, or legs.  You have not felt your baby move in over an hour.  You have severe headaches that do not go away when you take medicine.  You have vision changes. Summary  The second trimester is from week 14 through week 27 (months 4 through 6). It is also a time when the fetus is growing rapidly.  Your body goes through many changes during pregnancy. The changes vary from woman to woman.  Avoid all smoking, herbs, alcohol, and unprescribed drugs. These chemicals affect the formation and growth your baby.  Do not use any tobacco products, such as cigarettes, chewing tobacco, and e-cigarettes. If you need help quitting, ask your health care provider.  Contact your health care provider if you have any questions. Keep all prenatal visits as told by your health care provider. This is  important. This information is not intended to replace advice given to you by your health care provider. Make sure you discuss any questions you have with your health care provider. Document Released: 12/26/2000 Document Revised: 04/25/2018 Document Reviewed: 02/07/2016 Elsevier Patient Education  2020 [employer].   The Maternity Assessment Unit (MAU) is located at the [facility] at [facility]. The address is: [address] [place], [place], [place] [address]. Please see map below for additional directions.    The Maternity Assessment Unit is designed to help you during your pregnancy, and for up to 6 weeks after delivery, with any pregnancy- or postpartum-related emergencies, if you think you are in labor, or if your water has broken. For example, if you experience nausea and vomiting, vaginal bleeding, severe abdominal or pelvic pain, elevated blood pressure or other problems related to your pregnancy or postpartum time, please come to the Maternity Assessment Unit for assistance.  Pregnancy and Genital Herpes  Genital herpes is an STI (sexually transmitted infection) that is caused by the herpes simplex virus (HSV). HSV can cause  an outbreak of itching, blisters, and sores (ulcers) around the genitals and rectum. Even when the outbreak goes away, the virus stays in the body. If you are pregnant, you can pass HSV to your baby. If you become infected with HSV for the first time while you are pregnant, the virus can cause serious problems for your baby. If you had HSV before your pregnancy, the virus may not affect your baby as seriously. Babies that are infected with HSV are at risk for developing inflammation of the brain (encephalitis), damage to organs, and problems with development. How does this affect me? Your type of delivery may be affected. You may be able to have a vaginal delivery if you have no evidence of an outbreak when you go into labor.  However, your baby may need to be delivered by C-section (cesarean delivery) if you have:  An active, recurrent, or new herpes outbreak at the time of delivery. This is because the virus can pass to your baby through an infected birth canal. This can cause severe problems for your baby.  Any symptoms of infection in the areas around the genitals such as pain, burning, and itching, even if you do not have any ulcers in the birth canal. After delivery, you can breastfeed your baby. The virus will not be present in breast milk. While caring for your baby, you will need to take steps to avoid passing the virus on to your baby. How does this affect my baby? If the virus passes to your baby, it can cause serious problems. The virus can be passed to your baby:  Before delivery. The virus can be passed to your unborn baby through the placenta. This is more likely to happen if you get herpes for the first time in the first 3 months of pregnancy (first trimester). This may cause your baby to have a congenital disability.  During delivery. This is more likely to happen if you become infected for the first time late in your pregnancy.  After delivery. Your baby can get a herpes infection if you touch active ulcers and then touch your baby without washing your hands. The virus is less likely to pass to your baby if you had herpes before you became pregnant. This is because antibodies against the virus develop over a period of time. These antibodies help to protect the baby. How is this treated? This condition can be treated with medicines during pregnancy that are safe for you and your baby. These medicines can help to reduce symptoms, shorten an outbreak, and prevent another outbreak of the infection. If the infection happened before you became pregnant, you may need to take medicine late in your pregnancy to help to prevent a breakout at the time of delivery. Follow these instructions at home: To avoid  passing the virus to your baby:  Wash your hands with soap and water often and before touching your baby.  If you have an outbreak, keep the area clean and covered.  If ulcers are present on your breast, do not breastfeed from the affected breast. Contact a health care provider if:  You have a rash, blisters, or ulcers in the area around your genitals or rectum.  You have burning, itching, or pain in the area around your genitals or rectum.  You have trouble urinating. Summary  Genital herpes is an STI (sexually transmitted infection) that is caused by the herpes simplex virus (HSV). If you are pregnant, you can pass the virus to your  baby.  Even when the outbreak goes away, the virus stays in your body.  Genital herpes can be passed to your unborn or newborn baby and cause serious problems.  This condition can be treated with medicines during pregnancy that are safe for you and your baby. Medicines can treat your symptoms, shorten the length of an outbreak, and prevent another outbreak of the infection.  If you have signs or symptoms of a herpes outbreak when you go into labor, your health care provider may recommend a C-section (cesarean delivery) to lower the risk of passing the virus to your baby. This information is not intended to replace advice given to you by your health care provider. Make sure you discuss any questions you have with your health care provider. Document Released: 04/09/2000 Document Revised: 04/25/2018 Document Reviewed: 04/03/2018 Elsevier Patient Education  2020 [employer].  Preterm Labor and Birth Information  The normal length of a pregnancy is 39-41 weeks. Preterm labor is when labor starts before 37 completed weeks of pregnancy. What are the risk factors for preterm labor? Preterm labor is more likely to occur in women who:  Have certain infections during pregnancy such as a bladder infection, sexually transmitted infection, or infection inside the  uterus (chorioamnionitis).  Have a shorter-than-normal cervix.  Have gone into preterm labor before.  Have had surgery on their cervix.  Are younger than age 7 or older than age 89.  Are African American.  Are pregnant with twins or multiple babies (multiple gestation).  Take street drugs or smoke while pregnant.  Do not gain enough weight while pregnant.  Became pregnant shortly after having been pregnant. What are the symptoms of preterm labor? Symptoms of preterm labor include:  Cramps similar to those that can happen during a menstrual period. The cramps may happen with diarrhea.  Pain in the abdomen or lower back.  Regular uterine contractions that may feel like tightening of the abdomen.  A feeling of increased pressure in the pelvis.  Increased watery or bloody mucus discharge from the vagina.  Water breaking (ruptured amniotic sac). Why is it important to recognize signs of preterm labor? It is important to recognize signs of preterm labor because babies who are born prematurely may not be fully developed. This can put them at an increased risk for:  Long-term (chronic) heart and lung problems.  Difficulty immediately after birth with regulating body systems, including blood sugar, body temperature, heart rate, and breathing rate.  Bleeding in the brain.  Cerebral palsy.  Learning difficulties.  Death. These risks are highest for babies who are born before 34 weeks of pregnancy. How is preterm labor treated? Treatment depends on the length of your pregnancy, your condition, and the health of your baby. It may involve:  Having a stitch (suture) placed in your cervix to prevent your cervix from opening too early (cerclage).  Taking or being given medicines, such as: ? Hormone medicines. These may be given early in pregnancy to help support the pregnancy. ? Medicine to stop contractions. ? Medicines to help mature the babys lungs. These may be prescribed  if the risk of delivery is high. ? Medicines to prevent your baby from developing cerebral palsy. If the labor happens before 34 weeks of pregnancy, you may need to stay in the hospital. What should I do if I think I am in preterm labor? If you think that you are going into preterm labor, call your health care provider right away. How can I prevent  preterm labor in future pregnancies? To increase your chance of having a full-term pregnancy:  Do not use any tobacco products, such as cigarettes, chewing tobacco, and e-cigarettes. If you need help quitting, ask your health care provider.  Do not use street drugs or medicines that have not been prescribed to you during your pregnancy.  Talk with your health care provider before taking any herbal supplements, even if you have been taking them regularly.  Make sure you gain a healthy amount of weight during your pregnancy.  Watch for infection. If you think that you might have an infection, get it checked right away.  Make sure to tell your health care provider if you have gone into preterm labor before. This information is not intended to replace advice given to you by your health care provider. Make sure you discuss any questions you have with your health care provider. Document Released: 03/24/2003 Document Revised: 04/25/2018 Document Reviewed: 05/25/2015 Elsevier Patient Education  2020 [employer].

## 2018-11-13 LAB — CMV ABS, IGG+IGM (CYTOMEGALOVIRUS)
CMV Ab - IgG: 8.2 U/mL — ABNORMAL HIGH (ref 0.00–0.59)
CMV IgM Ser EIA-aCnc: <30 AU/mL (ref 0.0–29.9)

## 2018-11-13 LAB — INFECT DISEASE AB IGM REFLEX 1

## 2018-11-13 LAB — PARVOVIRUS B19 ANTIBODY, IGG AND IGM
Parvovirus B19 IgG: 6.9 index — ABNORMAL HIGH (ref 0.0–0.8)
Parvovirus B19 IgM: 0.2 index (ref 0.0–0.8)

## 2018-11-13 LAB — TOXOPLASMA GONDII ANTIBODY, IGG: Toxoplasma IgG Ratio: <3 IU/mL (ref 0.0–7.1)

## 2018-11-13 LAB — TOXOPLASMA GONDII ANTIBODY, IGM: Toxoplasma Antibody- IgM: 4.2 AU/mL (ref 0.0–7.9)

## 2018-11-25 ENCOUNTER — Telehealth (INDEPENDENT_AMBULATORY_CARE_PROVIDER_SITE_OTHER): Payer: Medicaid Other | Admitting: Family Medicine

## 2018-11-25 ENCOUNTER — Other Ambulatory Visit: Payer: Self-pay

## 2018-11-25 ENCOUNTER — Encounter: Payer: Medicaid Other | Admitting: Family Medicine

## 2018-11-25 VITALS — BP 111/67 | HR 97

## 2018-11-25 DIAGNOSIS — F319 Bipolar disorder, unspecified: Secondary | ICD-10-CM

## 2018-11-25 DIAGNOSIS — O99342 Other mental disorders complicating pregnancy, second trimester: Secondary | ICD-10-CM

## 2018-11-25 DIAGNOSIS — Z3A25 25 weeks gestation of pregnancy: Secondary | ICD-10-CM

## 2018-11-25 DIAGNOSIS — O09892 Supervision of other high risk pregnancies, second trimester: Secondary | ICD-10-CM

## 2018-11-25 DIAGNOSIS — Z141 Cystic fibrosis carrier: Secondary | ICD-10-CM

## 2018-11-25 DIAGNOSIS — O98212 Gonorrhea complicating pregnancy, second trimester: Secondary | ICD-10-CM

## 2018-11-25 DIAGNOSIS — O98219 Gonorrhea complicating pregnancy, unspecified trimester: Secondary | ICD-10-CM

## 2018-11-25 DIAGNOSIS — Z3492 Encounter for supervision of normal pregnancy, unspecified, second trimester: Secondary | ICD-10-CM

## 2018-11-25 DIAGNOSIS — O09899 Supervision of other high risk pregnancies, unspecified trimester: Secondary | ICD-10-CM

## 2018-11-25 DIAGNOSIS — O283 Abnormal ultrasonic finding on antenatal screening of mother: Secondary | ICD-10-CM

## 2018-11-25 NOTE — Progress Notes (Addendum)
I connected with@ on 11/25/18 at  1:55 PM EST by: video and verified that I am speaking with the correct person using two identifiers.  Patient is located at home and provider is located at office.     The purpose of this virtual visit is to provide medical care while limiting exposure to the novel coronavirus. I discussed the limitations, risks, security and privacy concerns of performing an evaluation and management service by video and the availability of in person appointments. I also discussed with the patient that there may be a patient responsible charge related to this service. By engaging in this virtual visit, you consent to the provision of healthcare.  Additionally, you authorize for your insurance to be billed for the services provided during this visit.  The patient expressed understanding and agreed to proceed.  The following staff members participated in the virtual visit:  CMA & MD    PRENATAL VISIT NOTE  Subjective:  Caitlin Terrell is a 33 y.o. G2P1001 at [redacted]w[redacted]d  for phone visit for ongoing prenatal care.  She is currently monitored for the following issues for this low-risk pregnancy and has Supervision of low-risk pregnancy; Bipolar disease in pregnancy ([place]); Asthma; Gonorrhea in pregnancy, antepartum; Cystic fibrosis carrier, antepartum; and Echogenic bowel of fetus on prenatal ultrasound on their problem list.  Patient reports some soreness in back and hips with intermittent cramping. Contractions: Not present. Vag. Bleeding: None.  Movement: Present. Denies leaking of fluid.   The following portions of the patient's history were reviewed and updated as appropriate: allergies, current medications, past family history, past medical history, past social history, past surgical history and problem list.   Objective:   Vitals:   11/25/18 1350 11/25/18 1411  BP: 113/79 111/67  Pulse: (!) 128 97   Self-Obtained  Fetal Status:     Movement: Present     Assessment and Plan:   Pregnancy: G2P1001 at [redacted]w[redacted]d 1. Encounter for supervision of low-risk pregnancy in second trimester Next visit in-person for 28 week labs and f/u OB growth scan with MFM Tdap at next visit RTC 11/23 and 11/25 as scheduled Message sent to patient by CMA with respect to acquiring maternity belt BTL paperwork at next visit  2. Cystic fibrosis carrier, antepartum Patient not currently in contact with partner to have testing done   3. Gonorrhea in pregnancy, antepartum Positive for gonorrhea and trich on 8/12 with negative TOC on 9/10  4. Echogenic bowel of fetus on prenatal ultrasound [place] drawn 10/28 - no evidence of active infection but will further review with [facility] at next visit; repeat growth scan/MFM consult on 11/25  5. Bipolar disease during pregnancy in second trimester [facility]) Reports being "at peace and that she is good." Appt with Behavioral Health 11/23  Preterm labor symptoms and general obstetric precautions including but not limited to vaginal bleeding, contractions, leaking of fluid and fetal movement were reviewed in detail with the patient.  No follow-ups on file.  Future Appointments  Date Time Provider [place]  12/08/2018  9:15 AM [place]  12/10/2018  8:20 AM WOC-WOCA LAB WOC-WOCA WOC  12/10/2018 10:15 AM [place] Mode, MD [place]  12/10/2018 10:45 AM WH-MFC [place] 2 WH-MFCUS MFC-US    Time spent on virtual visit: 25 minutes  Chauncey Mann, MD

## 2018-11-25 NOTE — Progress Notes (Signed)
Sent Pt info on were to purchase Maternity Belt [employer] thru My Chart.

## 2018-11-25 NOTE — Progress Notes (Signed)
I connected with  Jenne Sellinger on 11/25/18 at  1:55 PM EST by telephone and verified that I am speaking with the correct person using two identifiers.   I discussed the limitations, risks, security and privacy concerns of performing an evaluation and management service by telephone and the availability of in person appointments. I also discussed with the patient that there may be a patient responsible charge related to this service. The patient expressed understanding and agreed to proceed.  Bethanne Ginger, CMA 11/25/2018  1:46 PM

## 2018-11-27 ENCOUNTER — Other Ambulatory Visit: Payer: Self-pay

## 2018-11-27 DIAGNOSIS — Z141 Cystic fibrosis carrier: Secondary | ICD-10-CM

## 2018-11-27 DIAGNOSIS — O283 Abnormal ultrasonic finding on antenatal screening of mother: Secondary | ICD-10-CM

## 2018-11-27 DIAGNOSIS — Z3493 Encounter for supervision of normal pregnancy, unspecified, third trimester: Secondary | ICD-10-CM

## 2018-11-27 DIAGNOSIS — O09899 Supervision of other high risk pregnancies, unspecified trimester: Secondary | ICD-10-CM

## 2018-11-27 DIAGNOSIS — O98219 Gonorrhea complicating pregnancy, unspecified trimester: Secondary | ICD-10-CM

## 2018-11-27 DIAGNOSIS — F319 Bipolar disorder, unspecified: Secondary | ICD-10-CM

## 2018-11-27 MED ORDER — COMFORT FIT MATERNITY SUPP LG MISC: 1.0000 [IU] | Freq: Once | 0 refills | Status: AC

## 2018-12-05 NOTE — BH Specialist Note (Deleted)
Integrated Behavioral Health via Telemedicine Video Visit  12/05/2018 Ludia Gartland [id number]  Number of Integrated Behavioral Health visits: 2 Session Start time: 9:15***  Session End time: 10:15*** Total time: {IBH Total INOM:76720947}  Referring Provider: Vernice Jefferson, NP Type of Visit: Video Patient/Family location: Home [facility] Provider location: [facility] All persons participating in visit: Patient *** and [place] ***   Confirmed patient's address: Yes  Confirmed patient's phone number: Yes  Any changes to demographics: No   Confirmed patient's insurance: Yes  Any changes to patient's insurance: No   Discussed confidentiality: Yes   I connected with Micaila Ziemba  by a video enabled telemedicine application and verified that I am speaking with the correct person using two identifiers.     I discussed the limitations of evaluation and management by telemedicine and the availability of in person appointments.  I discussed that the purpose of this visit is to provide behavioral health care while limiting exposure to the novel coronavirus.   Discussed there is a possibility of technology failure and discussed alternative modes of communication if that failure occurs.  I discussed that engaging in this video visit, they consent to the provision of behavioral healthcare and the services will be billed under their insurance.  Patient and/or legal guardian expressed understanding and consented to video visit: Yes   PRESENTING CONCERNS: Patient and/or family reports the following symptoms/concerns: *** Duration of problem: ***; Severity of problem: {Mild/Moderate/Severe:20260}  STRENGTHS (Protective Factors/Coping Skills): ***  GOALS ADDRESSED: Patient will: 1.  Reduce symptoms of: {IBH Symptoms:21014056}  2.  Increase knowledge and/or ability of: {IBH Patient Tools:21014057}  3.  Demonstrate ability to: {IBH Goals:21014053}  INTERVENTIONS: Interventions  utilized:  {IBH Interventions:21014054} Standardized Assessments completed: {IBH Screening Tools:21014051}  ASSESSMENT: Patient currently experiencing ***.   Patient may benefit from psychoeducation and brief therapeutic interventions regarding coping with symptoms of *** .  PLAN: 1. Follow up with behavioral health clinician on : *** 2. Behavioral recommendations:  -*** -*** (self-coping, prenatal) 3. Referral(s): {IBH Referrals:21014055}  I discussed the assessment and treatment plan with the patient and/or parent/guardian. They were provided an opportunity to ask questions and all were answered. They agreed with the plan and demonstrated an understanding of the instructions.   They were advised to call back or seek an in-person evaluation if the symptoms worsen or if the condition fails to improve as anticipated.  Caroleen Hamman Broderick Fonseca

## 2018-12-08 ENCOUNTER — Other Ambulatory Visit: Payer: Self-pay

## 2018-12-08 ENCOUNTER — Institutional Professional Consult (permissible substitution): Payer: Medicaid Other | Admitting: Clinical

## 2018-12-10 ENCOUNTER — Other Ambulatory Visit: Payer: Medicaid Other

## 2018-12-10 ENCOUNTER — Other Ambulatory Visit: Payer: Self-pay

## 2018-12-10 ENCOUNTER — Ambulatory Visit (HOSPITAL_COMMUNITY)
Admission: RE | Admit: 2018-12-10 | Discharge: 2018-12-10 | Disposition: A | Discharge status: Home / Self Care | Payer: Medicaid Other | Source: Ambulatory Visit | Attending: Obstetrics and Gynecology | Admitting: Obstetrics and Gynecology

## 2018-12-10 ENCOUNTER — Other Ambulatory Visit (HOSPITAL_COMMUNITY): Payer: Self-pay | Admitting: *Deleted

## 2018-12-10 ENCOUNTER — Ambulatory Visit (INDEPENDENT_AMBULATORY_CARE_PROVIDER_SITE_OTHER): Payer: Medicaid Other | Admitting: Obstetrics & Gynecology

## 2018-12-10 VITALS — BP 120/77 | HR 106

## 2018-12-10 DIAGNOSIS — Z3492 Encounter for supervision of normal pregnancy, unspecified, second trimester: Secondary | ICD-10-CM

## 2018-12-10 DIAGNOSIS — Z362 Encounter for other antenatal screening follow-up: Secondary | ICD-10-CM | POA: Diagnosis present

## 2018-12-10 DIAGNOSIS — Z3493 Encounter for supervision of normal pregnancy, unspecified, third trimester: Secondary | ICD-10-CM | POA: Diagnosis present

## 2018-12-10 DIAGNOSIS — O359XX0 Maternal care for (suspected) fetal abnormality and damage, unspecified, not applicable or unspecified: Secondary | ICD-10-CM

## 2018-12-10 DIAGNOSIS — Z3A27 27 weeks gestation of pregnancy: Secondary | ICD-10-CM

## 2018-12-10 DIAGNOSIS — Z23 Encounter for immunization: Secondary | ICD-10-CM | POA: Diagnosis not present

## 2018-12-10 DIAGNOSIS — O09892 Supervision of other high risk pregnancies, second trimester: Secondary | ICD-10-CM

## 2018-12-10 NOTE — BH Specialist Note (Signed)
Integrated Behavioral Health via Telemedicine Video Visit  12/10/2018 Caitlin Terrell [id number]  Number of Integrated Behavioral Health visits: 2 Session Start time: 10:25  Session End time: 10:45 Total time: 20  Referring Provider: Tinnie Gens, MD Type of Visit: Video Patient/Family location: Home [facility] Provider location: [facility] All persons participating in visit: Patient Caitlin Terrell and [facility]      Confirmed patient's address: Yes  Confirmed patient's phone number: Yes  Any changes to demographics: No   Confirmed patient's insurance: Yes  Any changes to patient's insurance: No   Discussed confidentiality: Yes   I connected with Ainslie Mazurek  by a video enabled telemedicine application and verified that I am speaking with the correct person using two identifiers.     I discussed the limitations of evaluation and management by telemedicine and the availability of in person appointments.  I discussed that the purpose of this visit is to provide behavioral health care while limiting exposure to the novel coronavirus.   Discussed there is a possibility of technology failure and discussed alternative modes of communication if that failure occurs.  I discussed that engaging in this video visit, they consent to the provision of behavioral healthcare and the services will be billed under their insurance.  Patient and/or legal guardian expressed understanding and consented to video visit: Yes   PRESENTING CONCERNS: Patient and/or family reports the following symptoms/concerns: Pt states her primary concern today is worry over news that she has gestational diabetes, and fatigue. Pt is proud of her accomplishment of not smoking marijuana in 5 months. 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 Pt agrees to referral for psychiatry for  bipolar disorder.  Duration of problem: Ongoing; Severity of problem: mild  STRENGTHS (Protective Factors/Coping Skills): Self awareness and strong coping skills; support from mom(mom and mom's fiancee will be helping postpartum)  GOALS ADDRESSED: Patient will: 1.  Maintain reduction of symptoms of: mood instability  2.  Demonstrate ability to: Increase adequate support systems for patient/family and Increase motivation to adhere to plan of care  INTERVENTIONS: Interventions utilized:  Motivational Interviewing, Psychoeducation and/or Health Education and Link to [employer] Standardized Assessments completed: Took less than one week ago  ASSESSMENT: Patient currently experiencing Bipolar affective disorder.   Patient may benefit from continued brief therapeutic interventions today, and referral to psychiatry.  PLAN: 1. Follow up with behavioral health clinician on : Two weeks by phone 2. Behavioral recommendations:  -Accept referral to psychiatry (for [facility] med management, as needed postpartum) -Attend visit with diabetes educator tomorrow -Continue taking prenatal vitamin daily -Continue prioritizing refraining from marijuana use and healthy sleep, as protective factors in preventing mood instability -Continue with plans to take full 12 weeks of FMLA, along with attending 6 week real estate licensure virtual classes postpartum -Continue plan to have mother (and her fiancee) to provide in-home practical support postpartum -Consider viewing Virtual Tour of hospital via [email or web address]; share with support person 3. Referral(s): Integrated [facility] (In Clinic) and Psychiatrist  I discussed the assessment and treatment plan with the patient and/or parent/guardian. They were provided an opportunity to ask questions and all were answered. They agreed with the plan and demonstrated an understanding of the instructions.   They were advised to call back or seek an  in-person evaluation if the symptoms worsen or if the condition fails to improve  as anticipated.  Caroleen Hamman   Depression screen [facility] 2/9 12/10/2018 07/16/2018  Decreased Interest 1 3  Down, Depressed, Hopeless 1 2  PHQ - 2 Score 2 5  Altered sleeping 0 1  Tired, decreased energy 1 3  Change in appetite 0 3  Feeling bad or failure about yourself  0 3  Trouble concentrating 0 0  Moving slowly or fidgety/restless 0 1  Suicidal thoughts 0 0  PHQ-9 Score 3 16   GAD 7 : Generalized Anxiety Score 12/10/2018 07/16/2018  Nervous, Anxious, on Edge 1 3  Control/stop worrying 1 3  Worry too much - different things 1 3  Trouble relaxing 0 0  Restless 0 0  Easily annoyed or irritable 1 3  Afraid - awful might happen 0 1  Total GAD 7 Score 4 13

## 2018-12-10 NOTE — Progress Notes (Signed)
BTL consent signed at visit today.

## 2018-12-10 NOTE — Patient Instructions (Addendum)
Third Trimester of Pregnancy  The third trimester is from week 28 through week 40 (months 7 through 9). This trimester is when your unborn baby (fetus) is growing very fast. At the end of the ninth month, the unborn baby is about 20 inches in length. It weighs about 6-10 pounds. Follow these instructions at home: Medicines  Take over-the-counter and prescription medicines only as told by your doctor. Some medicines are safe and some medicines are not safe during pregnancy.  Take a prenatal vitamin that contains at least 600 micrograms (mcg) of folic acid.  If you have trouble pooping (constipation), take medicine that will make your stool soft (stool softener) if your doctor approves. Eating and drinking   Eat regular, healthy meals.  Avoid raw meat and uncooked cheese.  If you get low calcium from the food you eat, talk to your doctor about taking a daily calcium supplement.  Eat four or five small meals rather than three large meals a day.  Avoid foods that are high in fat and sugars, such as fried and sweet foods.  To prevent constipation: ? Eat foods that are high in fiber, like fresh fruits and vegetables, whole grains, and beans. ? Drink enough fluids to keep your pee (urine) clear or pale yellow. Activity  Exercise only as told by your doctor. Stop exercising if you start to have cramps.  Avoid heavy lifting, wear low heels, and sit up straight.  Do not exercise if it is too hot, too humid, or if you are in a place of great height (high altitude).  You may continue to have sex unless your doctor tells you not to. Relieving pain and discomfort  Wear a good support bra if your breasts are tender.  Take frequent breaks and rest with your legs raised if you have leg cramps or low back pain.  Take warm water baths (sitz baths) to soothe pain or discomfort caused by hemorrhoids. Use hemorrhoid cream if your doctor approves.  If you develop puffy, bulging veins (varicose  veins) in your legs: ? Wear support hose or compression stockings as told by your doctor. ? Raise (elevate) your feet for 15 minutes, 3-4 times a day. ? Limit salt in your food. Safety  Wear your seat belt when driving.  Make a list of emergency phone numbers, including numbers for family, friends, the hospital, and police and fire departments. Preparing for your baby's arrival To prepare for the arrival of your baby:  Take prenatal classes.  Practice driving to the hospital.  Visit the hospital and tour the maternity area.  Talk to your work about taking leave once the baby comes.  Pack your hospital bag.  Prepare the baby's room.  Go to your doctor visits.  Buy a rear-facing car seat. Learn how to install it in your car. General instructions  Do not use hot tubs, steam rooms, or saunas.  Do not use any products that contain nicotine or tobacco, such as cigarettes and e-cigarettes. If you need help quitting, ask your doctor.  Do not drink alcohol.  Do not douche or use tampons or scented sanitary pads.  Do not cross your legs for long periods of time.  Do not travel for long distances unless you must. Only do so if your doctor says it is okay.  Visit your dentist if you have not gone during your pregnancy. Use a soft toothbrush to brush your teeth. Be gentle when you floss.  Avoid cat litter boxes and soil   used by cats. These carry germs that can cause birth defects in the baby and can cause a loss of your baby (miscarriage) or stillbirth.  Keep all your prenatal visits as told by your doctor. This is important. Contact a doctor if:  You are not sure if you are in labor or if your water has broken.  You are dizzy.  You have mild cramps or pressure in your lower belly.  You have a nagging pain in your belly area.  You continue to feel sick to your stomach, you throw up, or you have watery poop.  You have bad smelling fluid coming from your vagina.  You have  pain when you pee. Get help right away if:  You have a fever.  You are leaking fluid from your vagina.  You are spotting or bleeding from your vagina.  You have severe belly cramps or pain.  You lose or gain weight quickly.  You have trouble catching your breath and have chest pain.  You notice sudden or extreme puffiness (swelling) of your face, hands, ankles, feet, or legs.  You have not felt the baby move in over an hour.  You have severe headaches that do not go away with medicine.  You have trouble seeing.  You are leaking, or you are having a gush of fluid, from your vagina before you are 37 weeks.  You have regular belly spasms (contractions) before you are 37 weeks. Summary  The third trimester is from week 28 through week 40 (months 7 through 9). This time is when your unborn baby is growing very fast.  Follow your doctor's advice about medicine, food, and activity.  Get ready for the arrival of your baby by taking prenatal classes, getting all the baby items ready, preparing the baby's room, and visiting your doctor to be checked.  Get help right away if you are bleeding from your vagina, or you have chest pain and trouble catching your breath, or if you have not felt your baby move in over an hour. This information is not intended to replace advice given to you by your health care provider. Make sure you discuss any questions you have with your health care provider. Document Released: 03/28/2009 Document Revised: 04/24/2018 Document Reviewed: 02/07/2016 Elsevier Patient Education  2020 Elsevier Inc. AREA PEDIATRIC/FAMILY PRACTICE PHYSICIANS  Central/Southeast [place] ([address]) . [facility] Melodie Bouillon, MD; Lum Babe, MD; Sheffield Slider, MD; Leveda Anna, MD; McDiarmid, MD; Jerene Bears, MD; Jennette Kettle, MD; Gwendolyn Grant, MD o [address] [place] [place], [place] [address] o [phone] o Mon-Fri 8:30-12:30, 1:30-5:00 o Providers come to see babies at [facility] o  Accepting Medicaid . [facility] o Limited providers who accept newborns: Docia Chuck, MD; Kateri Plummer, MD; Paulino Rily, MD o [address], [place], [place] [address] o [phone] o Mon-Fri 8:00-5:30 o Babies seen by providers at [facility] o Does NOT accept Medicaid o Please call early in hospitalization for appointment (limited availability)  . [facility] Fatima Sanger, MD o [address] [place], [place] [address] o [phone] o Mon, Tue, Thur, Fri 8:30-5:00, Wed 10:00-7:00 (closed 1-2pm) o Babies seen by [facility] providers o Accepting Medicaid . Donnie Coffin - Pediatrician Fae Pippin, MD o [address], [place], [place] [address] o [phone] o Mon-Fri 8:30-5:00, Sat 8:30-12:00 o Provider comes to see babies at [facility] o Accepting Medicaid o Must have been referred from current patients or contacted office prior to delivery .  [facility]) Leotis Pain, MD; Ave Filter, MD; Luna Fuse, MD; Kennedy Bucker, MD; Konrad Dolores, MD; Kathlene November, MD; Jenne Campus, MD; Lubertha South, MD; Wynetta Emery, MD; Duffy Rhody, MD; Gerre Couch, NP; Shirl Harris, NP o [address] [place], [place], [place] [address] o [phone] o Mon, Tue, Thur, Fri 8:30-5:30, Wed 9:30-5:30, Sat 8:30-12:30 o Babies seen by [facility] providers o Accepting Medicaid o Only accepting infants of first-time parents or siblings of current patients [facility] discharge coordinator will make follow-up appointment . Cyril Mourning o [address] [place], [place]  [address] o [phone]   Fax - [phone] . [facility] o [address], [place], [place]  [address] o Phone - [phone]   Fax - [phone] . Lucio Edward o [address], [place], [place]  [address] o [phone]  East/[place]ortheast [place] [phone]) . [facility] Jorge Mandril, MD; Alita Chyle, MD; Princella Ion, MD; MD; Earlene Plater, MD; Jamesetta Orleans, MD; Alvera Novel, MD; Clarene Duke, MD; Rana Snare, MD; Carmon Ginsberg, MD; Alinda Money, MD; Hosie Poisson, MD; Mayford Knife, MD o [address], [place], [place] [address] o [phone] o Mon-Fri 8:30-5:00 (extended evenings Mon-Thur as needed), Sat-Sun 10:00-1:00 o Providers come to see babies at [facility] o Accepting Medicaid for families of first-time babies and families with all children in the household age 16 and under. Must register with office prior to making appointment (M-F only). [facility] Odella Aquas, NP; Lynelle Doctor, MD; Susann Givens, MD; [place], [place] o [address], [place], [place] [address] o [phone] o Mon-Fri 8:00-5:00 o Babies seen by providers at [facility] o Does NOT accept Medicaid/Commercial Insurance Only . [facility] at [place] [facility])  Suzette Battiest, MD; Zachery Dauer, MD; Stefan Church, MD; Sabino Dick, MD; Quitman Livings, MD; Farris Has, MD; Gaynell Face, MD; Betha Loa, MD; Colon Flattery, MD; Clifton Lebaron Bautch, MD o [address] [place] [place], [place] [address] o [phone] o Mon-Fri 8:30-5:30, Sat (Oct.-Mar.) 9:00-1:00 o Babies seen by providers at [facility] o Accepting [facility] [phone]) . [facility] Dimitri, MD; Sheliah Hatch, MD o [address], [place], [place] [address] o [phone] o Mon-Fri 8:30-5:00, Sat 8:30-12:00 o Providers come to see babies at [facility] o Does NOT accept Medicaid . [facility] Cindy Hazy, [place]; Sabina, MD; [place], [place]; Wynelle Link, MD; Azucena Cecil, MD o [address], [place], [place] [address] o [phone] o Mon-Fri 8:00-5:00 o Babies seen by providers at [facility] o Does NOT accept Medicaid o Only accepting babies of parents who are patients o Please call early in hospitalization for appointment (limited availability) . [facility] Lamar Benes, MD; Abran Cantor, MD; Early Osmond, MD; Cherre Huger, NP; Hyacinth Meeker, MD; Dwan Bolt, MD; Jarold Motto,  NP; Dario Guardian, MD; Talmage Nap, MD; Maisie Fus, MD; Pricilla Holm, MD; Tama High, MD o [address] [place], [place], [place] [address] o [phone] o Mon-Fri 8:00-5:00, Sat 9:00-12:00 o Providers come to see babies at [facility] o Does NOT accept [facility] [phone]) . [facility] at [facility] o Limited providers accepting new patients: Drema Pry, NP; [place], PA o [address] [place], [place] [address] o [phone] o Mon-Fri 8:00-5:00 o Babies seen by providers at [facility] o Does NOT accept Medicaid o Only accepting babies of parents who are patients o Please call early in hospitalization for appointment (limited availability) . [facility] Luan Pulling, MD; Nash Dimmer, MD o [address] [place] [place], [place] [address] o [phone] (press [phone]  to schedule appointment) o Mon-Fri 8:00-5:00 o Providers come to see babies at [facility] o Does NOT accept Medicaid . [facility] Cristino Marteso Mazer, MD o [address] [place], [place] [address] o [phone] o Mon-Fri 8:30-5:00 (lunch 12:30-1:00), extended hours by appointment only Wed 5:00-6:30 o Babies seen by [facility] providers o Accepting Medicaid . [facility] o Banks, MD; SwazilandJordan, MD; Hassan RowanKoberlein, MD o [address], [place], [place] [address] o [phone] o Mon-Fri 8:00-5:00 o Babies seen by [facility] providers o Does NOT accept Medicaid . [facility] [address] Sohoo Parker, MD; Durene CalHunter, MD; HartWallace, DO o [address] [place], [place] [address] o [phone] o Mon-Fri 8:00-5:00 o Babies seen by [facility] providers o Does NOT accept Medicaid . [facility] o K[place]andon, [place]; Colog[place]ck[place], [place]; Hamburghristy, NP; Avis Epleyees, MD; Vonna KotykeClaire, MD; Clance BolleWeese, MD; Stevphen RochesterHansen, NP; Arvilla MarketMills, NP; Ann MakiParrish, NP; Otis DialsSmoot, NP; Vaughan BastaSummer, MD; ViolaVapne, MD o [address] [place], [place] [address] o [phone] o Mon-Fri 8:30-5:00, Sat 10:00-1:00 o  Providers come to see babies at [facility] o Does NOT accept Medicaid o Free prenatal information session Tuesdays at 4:45pm . [facility] Luna Kitchenso Bouska, MD; ElectraGordon, [place]; Earl ParkJeffery, [place]; Weber, [place] o [address] [place] [address] [place] [address] o [phone] o Mon-Fri 7:30-5:30 o Babies seen by [facility] providers . [facility] o [address], [place], [place]  [address] o [phone]   Fax - [phone]  [place] [phone] & [phone]) . [facility] Alphonsa Overallo Reese, MD o [address] [place], [place] [address] o [phone] o Mon-Thur 8:00-6:00 o Providers come to see babies at [facility] o Accepting Medicaid . [facility] Zenon Mayoo Anderson, NP; Cyndia BentBadger, MD; [place], [place]; Pine LevelSpencer, [place] o [address] [place], [place] [address] o [phone] o Mon-Thur 7:30-7:30, Fri 7:30-4:30 o Babies seen by [facility] providers o Accepting Medicaid . [facility] Cheryle Horsfallo Agbuya, MD; Janene HarveyKlett, NP; Vonita Mossomgoolam, MD o [address], [place], [place] [address] o [phone] o Mon-Fri 8:30-5:00, Sat 8:30-12:00 o Providers come to see babies at [facility] o Accepting Medicaid o Must have "Meet & Greet" appointment at office prior to delivery . [facility] [place] [facility] [place]) Llana Alimento McCord, MD; Earlene PlaterWallace, MD; Lucretia RoersWood, MD o [address], [place], [place] [address] o [phone] o Mon-Wed 8:00-6:00, Thur-Fri 8:00-5:00, Sat 9:00-12:00 o Providers come to see babies at [facility] o Does NOT accept Medicaid o Only accepting siblings of current patients . [facility]  o [address], [place], [place]  [address] o [phone]   Fax - [phone] . [facility] Jeanette o [phone], [place], [place]  [address] o [phone]   Fax - [phone]  [place]west  [place] [phone] & [phone]) . [facility] conservation [facility] o Clermontirigliano, DO; UnityMatthews, DO o [address] [place], [place] [address] o [phone] o Mon-Fri 7:00-5:00 o Babies seen by [facility] providers o Does NOT accept Medicaid . [facility] Ellis Savageo Briscoe, MD; [place], [place]; [place], [place] o [address], [place], [place] [address] o [phone] o Mon-Fri 8:00-5:00 o Babies seen by [facility] providers o Accepting Medicaid . [facility] - [address] Franne Fortso Boyd, MD; Bur[place]urch, [place]; TonopahJones, NP; Bre[place] Bridge[place], [place] o [address], [place], [place] [address] o [phone] o Mon-Fri  8:00-5:00 o Babies seen by providers at [facility] o Accepting [facility] [phone]) . [facility] [place], [place] o [address] [place], [place] [address] o [phone] o Mon-Fri 8:00-5:00 o Babies seen by [facility] providers o Does NOT accept Medicaid o Limited availability, please call early in hospitalization to schedule follow-up . [facility] Jolee Ewing, PA; Eddie Candle, MD; [place], MD; [place], [place]; Constance Goltz, MD; [place], [place] o [facility] [facility] [address], [place], [place] [address] o [phone] o Mon-Fri 8:30-5:00, Sat 9:00-12:00 o Babies seen by providers at [facility] o Accepting Medicaid o Please register online then schedule online or call office o [email or web address] . [facility]) Samuella Bruin, NP; Lucianne Muss, MD; Lanier Clam, PA o [address], [place], [place] [address] o [phone] o Mon-Fri 8:00-5:00 o Babies seen by providers at [facility] o Accepting Medicaid . [facility] ([facility]) Sharin Mons, MD; Reed Breech, NP; Shelva Majestic, MD o [address], [place], [place] [address] o [phone]  o Mon-Fri 8:00-5:30, Sat&Sun by appointment (phones open at 8:30) o Babies seen by [facility] providers o Accepting Medicaid o Must be a first-time baby or sibling of current patient . [facility] [address], [place], [place]  [address] o [phone]   Fax - [phone]  [place] [phone] & [phone]) . [facility] o [place], [place]; [place], [place]; Dimple Casey, MD; [place], [place]; Carolyne Fiscal, MD o [address], [place], [place] [address] o [phone] o Mon-Thur 8:00-7:00, Fri 8:00-5:00, Sat 8:00-12:00, Sun 9:00-12:00 o Babies seen by [facility] providers o Accepting Medicaid . [facility] at [facility], MD; Gaynell Face, MD; [facility], MD o [address], [place], [place] [address] o [phone] o Mon-Thur 8:00-5:00 o Babies seen by providers at [facility] o Accepting Medicaid . [facility] Gwenlyn Saran, MD; Coe-Goins, MD; Madilyn Fireman, MD; Melvyn Neth, MD; List, MD; Lazarus Salines, MD; Gaynell Face, MD; Berneda Rose, MD; Flora Lipps, MD; Beryl Meager, MD; Luther Redo, MD; Lavonia Drafts, MD; Kellie Simmering, MD o [address] [place] [place], [place] [address] o [phone] o Mon-Fri 8:00-5:30, Sat (Oct.-Mar.) 9:00-1:00 o Babies seen by providers at [facility] o Accepting Medicaid o Must fill out new patient packet, available online at [email or web address] . [facility] - [facility] at [facility]) Simone Curia, NP; Tiburcio Pea, NP; Tresa Endo, NP; Whitney Post, MD; [place], [place]; Hennie Duos, MD; Wynne Dust, MD; Kavin Leech, NP o [address], [place], [place] [address] o [phone] o Mon-Thur 8:00-5:30, Fri 8:00-5:00 o Babies seen by providers at [facility] o Accepting [facility] [phone]) . [facility] o [place], [place]; [place], MD; Tanya Nones, MD; [place], [place] o [address] [place], [place] [address] o [phone] o  Mon-Fri 8:00-5:00 o Babies seen by providers at [facility] o Accepting [facility] [phone]) . [facility] at [facility] o [place], DO; Lenise Arena, MD; [place], [place] o [address] [place] [address], [place], [place] [address] o [phone] o Mon-Fri 8:00-5:00 o Babies seen by providers at [facility] o Does NOT accept Medicaid o Limited appointment availability, please call early in hospitalization  . Nature conservation officer at [facility] o Kenney, DO; Milinda Cave, MD o [phone]  [address] [place], [place] [address] o [phone] o Mon-Fri 8:00-5:00 o Babies seen by [facility] providers o Does NOT accept Medicaid . [facility] Su Grand, MD; Guy Sandifer, MD; [place], [place]; [place], [address], [place], [place] [address] o [phone] o Mon-Fri 8:00-5:00 o After hours clinic [facility] [place], [place] [address]) [phone] Mon-Fri 5:00-8:00, Sat 12:00-6:00, Sun 10:00-4:00 o Babies seen by [facility] providers o Accepting Medicaid . [place] at [facility] o [address] [place], [place]  [address] o [phone]   Fax - [phone]  [place] [phone]) . Therapist, music at [facility], MD o [address] [place], [place], [place] [address] o [phone] o Mon-Fri 8:00-5:00 o Babies seen by [facility] providers o Does NOT accept Medicaid . [place] [facility] [facility] [place]) Bing Neighbors, MD o [address] [place], [place], [place] [address] o [phone] o Mon-Thur 8:00-7:00, Fri 8:00-5:00, Sat 8:00-12:00 o Babies seen by providers at [facility] o Accepting Medicaid - but does not have vaccinations in office (must be received elsewhere) o Limited availability, please call early in hospitalization  [place] ([address]) . [place], [place] [address] [place] [address] o  [phone]  Fax [phone]

## 2018-12-10 NOTE — Progress Notes (Signed)
   PRENATAL VISIT NOTE  Subjective:  Caitlin Terrell is a 33 y.o. G2P1001 at [redacted]w[redacted]d being seen today for ongoing prenatal care.  She is currently monitored for the following issues for this low-risk pregnancy and has Supervision of low-risk pregnancy; Bipolar disease in pregnancy ([place]); Asthma; Gonorrhea in pregnancy, antepartum; Cystic fibrosis carrier, antepartum; and Echogenic bowel of fetus on prenatal ultrasound on their problem list.  Patient reports ankle swelling.  Contractions: Not present. Vag. Bleeding: None.  Movement: Present. Denies leaking of fluid.   The following portions of the patient's history were reviewed and updated as appropriate: allergies, current medications, past family history, past medical history, past social history, past surgical history and problem list.   Objective:   Vitals:   12/10/18 0854  BP: 120/77  Pulse: (!) 106    Fetal Status:     Movement: Present     General:  Alert, oriented and cooperative. Patient is in no acute distress.  Skin: Skin is warm and dry. No rash noted.   Cardiovascular: Normal heart rate noted  Respiratory: Normal respiratory effort, no problems with respiration noted  Abdomen: Soft, gravid, appropriate for gestational age.  Pain/Pressure: Present     Pelvic: Cervical exam deferred        Extremities: Normal range of motion.  Edema: Mild pitting, slight indentation  Mental Status: Normal mood and affect. Normal behavior. Normal judgment and thought content.   Assessment and Plan:  Pregnancy: G2P1001 at [redacted]w[redacted]d 1. Encounter for supervision of low-risk pregnancy in third trimester Routine 28 weeks - Tdap vaccine greater than or equal to 7yo IM  Preterm labor symptoms and general obstetric precautions including but not limited to vaginal bleeding, contractions, leaking of fluid and fetal movement were reviewed in detail with the patient. Please refer to After Visit Summary for other counseling recommendations.   Return in  about 4 weeks (around 01/07/2019).  Future Appointments  Date Time Provider [place]  12/10/2018 10:15 AM Woodroe Mode, MD [facility] WOC  12/10/2018 10:45 AM Bertie [place] 2 WH-MFCUS MFC-US  12/16/2018 10:15 AM [place]    Emeterio Reeve, MD

## 2018-12-11 LAB — CBC
Hematocrit: 35.5 % (ref 34.0–46.6)
Hemoglobin: 12.2 g/dL (ref 11.1–15.9)
MCH: 32.5 pg (ref 26.6–33.0)
MCHC: 34.4 g/dL (ref 31.5–35.7)
MCV: 95 fL (ref 79–97)
Platelets: 206 10*3/uL (ref 150–450)
RBC: 3.75 x10E6/uL — ABNORMAL LOW (ref 3.77–5.28)
RDW: 13.1 % (ref 11.7–15.4)
WBC: 7.7 10*3/uL (ref 3.4–10.8)

## 2018-12-11 LAB — GLUCOSE TOLERANCE, 2 HOURS W/ 1HR
Glucose, 1 hour: 188 mg/dL — ABNORMAL HIGH (ref 65–179)
Glucose, 2 hour: 151 mg/dL (ref 65–152)
Glucose, Fasting: 100 mg/dL — ABNORMAL HIGH (ref 65–91)

## 2018-12-11 LAB — HIV ANTIBODY (ROUTINE TESTING W REFLEX): HIV Screen 4th Generation wRfx: NONREACTIVE

## 2018-12-11 LAB — RPR: RPR Ser Ql: NONREACTIVE

## 2018-12-15 ENCOUNTER — Encounter: Payer: Self-pay | Admitting: *Deleted

## 2018-12-15 ENCOUNTER — Encounter: Payer: Self-pay | Admitting: Women's Health

## 2018-12-15 ENCOUNTER — Telehealth: Payer: Self-pay | Admitting: Family Medicine

## 2018-12-15 ENCOUNTER — Telehealth: Payer: Self-pay | Admitting: *Deleted

## 2018-12-15 DIAGNOSIS — O24419 Gestational diabetes mellitus in pregnancy, unspecified control: Secondary | ICD-10-CM | POA: Insufficient documentation

## 2018-12-15 NOTE — Telephone Encounter (Addendum)
-----   Message from Clarisa Fling, NP sent at 12/15/2018  1:47 PM EST ----- Good afternoon - please call patient to make her aware she has tested positive for gestational diabetes. Please enter order for diabetes and testing supplies as well as nutritional consultation and assist with facilitating an appointment ASAP. Patient also needs baseline CMP. Please facilitate lab visit. Thank you, Elmyra Ricks   11/30  1359  Called pt to discuss recent test results and plan of care. She did not answer and VM was full - unable to leave message. MyChart message sent to pt regarding test results and she will be scheduled for Diabetes Education appointment. CMP test added to blood sample which was drawn on 11/25 and still available @ [place].

## 2018-12-15 NOTE — Telephone Encounter (Signed)
Attempted to call Ms. Leavens about needing to have a Diabetes Education appointment. Scheduled one with Rise Paganini in the office here, but was not soon enough. Called [facility] to get a sooner appointment. Was not able to reach Ms. Signer to give her this information. She also has questions.

## 2018-12-15 NOTE — Progress Notes (Signed)
Good afternoon - please call patient to make her aware she has tested positive for gestational diabetes. Please enter order for diabetes and testing supplies as well as nutritional consultation and assist with facilitating an appointment ASAP. Patient also needs baseline CMP. Please facilitate lab visit. Thank you, Elmyra Ricks

## 2018-12-15 NOTE — Telephone Encounter (Signed)
Called and spoke w/pt. She has read the MyChart message which I sent to her earlier today. I advised her of appt on 12/2 @ 0900 for Diabetes Education. Pt had additional questions regarding Diabetes in pregnancy which I answered to the best of my ability. Pt was advised that some of her questions regarding timing of delivery, possibility of C/S, size of baby, etc cannot be answered at this time as we will need to see how the pregnancy progresses. Pt agreed to plan of care and voiced understanding of all information and instructions given.

## 2018-12-16 ENCOUNTER — Encounter: Payer: Medicaid Other | Admitting: Advanced Practice Midwife

## 2018-12-16 ENCOUNTER — Ambulatory Visit (INDEPENDENT_AMBULATORY_CARE_PROVIDER_SITE_OTHER): Payer: Medicaid Other | Admitting: Clinical

## 2018-12-16 ENCOUNTER — Other Ambulatory Visit: Payer: Self-pay

## 2018-12-16 ENCOUNTER — Other Ambulatory Visit: Payer: Medicaid Other

## 2018-12-16 DIAGNOSIS — F319 Bipolar disorder, unspecified: Secondary | ICD-10-CM | POA: Diagnosis not present

## 2018-12-17 ENCOUNTER — Encounter: Payer: Self-pay | Admitting: Registered"

## 2018-12-17 ENCOUNTER — Encounter: Payer: Medicaid Other | Attending: Family Medicine | Admitting: Registered"

## 2018-12-17 ENCOUNTER — Other Ambulatory Visit: Payer: Self-pay | Admitting: *Deleted

## 2018-12-17 ENCOUNTER — Encounter: Payer: Self-pay | Admitting: *Deleted

## 2018-12-17 ENCOUNTER — Other Ambulatory Visit: Payer: Self-pay

## 2018-12-17 DIAGNOSIS — O99810 Abnormal glucose complicating pregnancy: Secondary | ICD-10-CM | POA: Diagnosis not present

## 2018-12-17 DIAGNOSIS — O24419 Gestational diabetes mellitus in pregnancy, unspecified control: Secondary | ICD-10-CM

## 2018-12-17 NOTE — Progress Notes (Signed)
MyChart message sent to pt requesting to know what type of lancet device she received with her Accu-chek Guide glucose meter today so that her testing supplies can be ordered. Need to determine if pt requires Fastclix or Softclix lancets. Orders initiated and will be finalized following receiving word from pt.

## 2018-12-17 NOTE — Progress Notes (Signed)
Patient was seen on 12/17/18 for Gestational Diabetes self-management class at [facility]. The following learning objectives were met by the patient during this course:   States the definition of Gestational Diabetes  States why dietary management is important in controlling blood glucose  Describes the effects each nutrient has on blood glucose levels  Demonstrates ability to create a balanced meal plan  Demonstrates carbohydrate counting   States when to check blood glucose levels  Demonstrates proper blood glucose monitoring techniques  States the effect of stress and exercise on blood glucose levels  States the importance of limiting caffeine and abstaining from alcohol and smoking  Blood glucose monitor given: Accu-chek Guide me Lot # [id number] Exp: 02/04/20 Blood glucose reading: 95 mg/dL  Patient instructed to monitor glucose levels: FBS: 60 - <95; 1 hour: <140; 2 hour: <120  Patient received handouts:  Nutrition Diabetes and Pregnancy, including carb counting list  Patient will be seen for follow-up as needed.

## 2018-12-18 MED ORDER — ACCU-CHEK GUIDE VI STRP: ORAL_STRIP | 12 refills | Status: DC

## 2018-12-18 MED ORDER — ACCU-CHEK SOFTCLIX LANCETS MISC: 12 refills | Status: DC

## 2018-12-30 ENCOUNTER — Encounter: Payer: Medicaid Other | Admitting: Student

## 2018-12-31 ENCOUNTER — Other Ambulatory Visit: Payer: Medicaid Other

## 2018-12-31 DIAGNOSIS — B379 Candidiasis, unspecified: Secondary | ICD-10-CM

## 2018-12-31 MED ORDER — TERCONAZOLE 0.4 % VA CREA: 1.0000 | TOPICAL_CREAM | Freq: Every day | VAGINAL | 0 refills | Status: DC

## 2018-12-31 MED ORDER — FLUCONAZOLE 150 MG PO TABS: 150.0000 mg | ORAL_TABLET | Freq: Once | ORAL | 0 refills | Status: AC

## 2019-01-06 LAB — COMPREHENSIVE METABOLIC PANEL
ALT: 13 IU/L (ref 0–32)
AST: 16 IU/L (ref 0–40)
Albumin/Globulin Ratio: 1.8 (ref 1.2–2.2)
Albumin: 3.9 g/dL (ref 3.8–4.8)
Alkaline Phosphatase: 87 IU/L (ref 39–117)
BUN/Creatinine Ratio: 12 (ref 9–23)
BUN: 7 mg/dL (ref 6–20)
Bilirubin Total: <0.2 mg/dL (ref 0.0–1.2)
CO2: 16 mmol/L — ABNORMAL LOW (ref 20–29)
Calcium: 9.2 mg/dL (ref 8.7–10.2)
Chloride: 103 mmol/L (ref 96–106)
Creatinine, Ser: 0.6 mg/dL (ref 0.57–1.00)
GFR calc Af Amer: 140 mL/min/{1.73_m2} (ref >59)
GFR calc non Af Amer: 122 mL/min/{1.73_m2} (ref >59)
Globulin, Total: 2.2 g/dL (ref 1.5–4.5)
Glucose: 98 mg/dL (ref 65–99)
Potassium: 4.1 mmol/L (ref 3.5–5.2)
Sodium: 136 mmol/L (ref 134–144)
Total Protein: 6.1 g/dL (ref 6.0–8.5)

## 2019-01-06 LAB — SPECIMEN STATUS REPORT

## 2019-01-07 ENCOUNTER — Other Ambulatory Visit: Payer: Self-pay

## 2019-01-07 ENCOUNTER — Telehealth (INDEPENDENT_AMBULATORY_CARE_PROVIDER_SITE_OTHER): Payer: Medicaid Other | Admitting: Obstetrics and Gynecology

## 2019-01-07 VITALS — BP 117/75 | HR 100

## 2019-01-07 DIAGNOSIS — O99343 Other mental disorders complicating pregnancy, third trimester: Secondary | ICD-10-CM

## 2019-01-07 DIAGNOSIS — O24419 Gestational diabetes mellitus in pregnancy, unspecified control: Secondary | ICD-10-CM | POA: Diagnosis not present

## 2019-01-07 DIAGNOSIS — O98213 Gonorrhea complicating pregnancy, third trimester: Secondary | ICD-10-CM | POA: Diagnosis not present

## 2019-01-07 DIAGNOSIS — Z8619 Personal history of other infectious and parasitic diseases: Secondary | ICD-10-CM

## 2019-01-07 DIAGNOSIS — Z141 Cystic fibrosis carrier: Secondary | ICD-10-CM

## 2019-01-07 DIAGNOSIS — O09899 Supervision of other high risk pregnancies, unspecified trimester: Secondary | ICD-10-CM

## 2019-01-07 DIAGNOSIS — O09893 Supervision of other high risk pregnancies, third trimester: Secondary | ICD-10-CM | POA: Diagnosis not present

## 2019-01-07 DIAGNOSIS — O283 Abnormal ultrasonic finding on antenatal screening of mother: Secondary | ICD-10-CM

## 2019-01-07 DIAGNOSIS — Z3493 Encounter for supervision of normal pregnancy, unspecified, third trimester: Secondary | ICD-10-CM

## 2019-01-07 DIAGNOSIS — O98219 Gonorrhea complicating pregnancy, unspecified trimester: Secondary | ICD-10-CM

## 2019-01-07 DIAGNOSIS — Z3A31 31 weeks gestation of pregnancy: Secondary | ICD-10-CM

## 2019-01-07 DIAGNOSIS — F319 Bipolar disorder, unspecified: Secondary | ICD-10-CM

## 2019-01-07 NOTE — Progress Notes (Signed)
I connected with  Luther Newhouse on 01/07/19 at  9:35 AM EST by telephone and verified that I am speaking with the correct person using two identifiers.   I discussed the limitations, risks, security and privacy concerns of performing an evaluation and management service by telephone and the availability of in person appointments. I also discussed with the patient that there may be a patient responsible charge related to this service. The patient expressed understanding and agreed to proceed.  [place], [place] 01/07/2019  9:24 AM

## 2019-01-07 NOTE — Progress Notes (Signed)
    TELEHEALTH VIRTUAL OBSTETRICS VISIT ENCOUNTER NOTE  Clinic: [facility]  I connected with Caitlin Terrell on 01/07/19 at  9:35 AM EST by telephone at home and verified that I am speaking with the correct person using two identifiers.   I discussed the limitations, risks, security and privacy concerns of performing an evaluation and management service by telephone and the availability of in person appointments. I also discussed with the patient that there may be a patient responsible charge related to this service. The patient expressed understanding and agreed to proceed.  Subjective:  Caitlin Terrell is a 33 y.o. G2P1001 at [redacted]w[redacted]d being followed for ongoing prenatal care.  She is currently monitored for the following issues for this high-risk pregnancy and has Supervision of low-risk pregnancy; Bipolar disease in pregnancy ([place]); Asthma; Gonorrhea in pregnancy, antepartum; Cystic fibrosis carrier, antepartum; Echogenic bowel of fetus on prenatal ultrasound; Gestational diabetes; and History of herpes genitalis on their problem list.  Patient reports no complaints. Reports fetal movement. Denies any contractions, bleeding or leaking of fluid.   The following portions of the patient's history were reviewed and updated as appropriate: allergies, current medications, past family history, past medical history, past social history, past surgical history and problem list.   Objective:   Vitals:   01/07/19 0926  BP: 117/75  Pulse: 100    Babyscripts Data Reviewed: yes  General:  Alert, oriented and cooperative.   Mental Status: Normal mood and affect perceived. Normal judgment and thought content.  Rest of physical exam deferred due to type of encounter  Assessment and Plan:  Pregnancy: G2P1001 at [redacted]w[redacted]d 1. Gestational diabetes mellitus (GDM) in third trimester, gestational diabetes method of control unspecified AM fasting Most recent-->older 89, 104, 104, 92, 91,  110, 104, 115 2 hour postprandials 115, 115, 129, 133, 101, 116, 122, 96  119, 117, 121, 91, 128, 138, 116, 102  120, 118, 128, 117, 113, 108, 125, 124 Borderline lines but does appear to be improving. Follow up one week. Has rpt growth for 12/30  2. Echogenic bowel of fetus on prenatal ultrasound No current issues. Followed by [facility]fm  3. Cystic fibrosis carrier, antepartum  4. Gonorrhea in pregnancy, antepartum toc nv  5. Encounter for supervision of high-risk pregnancy in third trimester Routine care  7. History of herpes genitalis Recommend valtrex ppx in a few weeks  Preterm labor symptoms and general obstetric precautions including but not limited to vaginal bleeding, contractions, leaking of fluid and fetal movement were reviewed in detail with the patient.  I discussed the assessment and treatment plan with the patient. The patient was provided an opportunity to ask questions and all were answered. The patient agreed with the plan and demonstrated an understanding of the instructions. The patient was advised to call back or seek an in-person office evaluation/go to [facility]U at [facility] for any urgent or concerning symptoms. Please refer to After Visit Summary for other counseling recommendations.   I provided 10 minutes of non-face-to-face time during this encounter. The visit was conducted via MyChart-medicine  Return in about 1 week (around 01/14/2019) for high risk, in person.  Future Appointments  Date Time Provider [place]  01/14/2019  8:55 AM Jacob Moores [place]  01/14/2019 10:00 AM WH-MFC [place] 3 WH-MFCUS MFC-US  01/14/2019 10:10 AM WH-MFC NURSE WH-MFC MFC-US    Aletha Halim, MD [facility], [place]

## 2019-01-12 ENCOUNTER — Telehealth: Payer: Self-pay | Admitting: *Deleted

## 2019-01-12 NOTE — Telephone Encounter (Signed)
I called Caitlin Terrell re: her MyChart message that her meter was not working. She reported she took battery out and put back that same day- 01/08/19 and it has been working fine since then.  No other issues reported. Jiro Kiester,RN

## 2019-01-13 ENCOUNTER — Encounter: Payer: Medicaid Other | Admitting: Student

## 2019-01-14 ENCOUNTER — Other Ambulatory Visit (HOSPITAL_COMMUNITY): Payer: Self-pay | Admitting: *Deleted

## 2019-01-14 ENCOUNTER — Encounter (HOSPITAL_COMMUNITY): Payer: Self-pay

## 2019-01-14 ENCOUNTER — Other Ambulatory Visit: Payer: Self-pay

## 2019-01-14 ENCOUNTER — Ambulatory Visit (INDEPENDENT_AMBULATORY_CARE_PROVIDER_SITE_OTHER): Payer: Medicaid Other | Admitting: Family Medicine

## 2019-01-14 ENCOUNTER — Ambulatory Visit (HOSPITAL_COMMUNITY)
Admission: RE | Admit: 2019-01-14 | Discharge: 2019-01-14 | Disposition: A | Discharge status: Home / Self Care | Payer: Medicaid Other | Source: Ambulatory Visit | Attending: Obstetrics and Gynecology | Admitting: Obstetrics and Gynecology

## 2019-01-14 ENCOUNTER — Ambulatory Visit (HOSPITAL_COMMUNITY): Payer: Medicaid Other | Admitting: *Deleted

## 2019-01-14 VITALS — BP 124/84 | HR 113 | Wt 197.7 lb

## 2019-01-14 DIAGNOSIS — O98513 Other viral diseases complicating pregnancy, third trimester: Secondary | ICD-10-CM

## 2019-01-14 DIAGNOSIS — O24419 Gestational diabetes mellitus in pregnancy, unspecified control: Secondary | ICD-10-CM

## 2019-01-14 DIAGNOSIS — O24415 Gestational diabetes mellitus in pregnancy, controlled by oral hypoglycemic drugs: Secondary | ICD-10-CM

## 2019-01-14 DIAGNOSIS — B379 Candidiasis, unspecified: Secondary | ICD-10-CM

## 2019-01-14 DIAGNOSIS — B009 Herpesviral infection, unspecified: Secondary | ICD-10-CM

## 2019-01-14 DIAGNOSIS — O283 Abnormal ultrasonic finding on antenatal screening of mother: Secondary | ICD-10-CM

## 2019-01-14 DIAGNOSIS — F319 Bipolar disorder, unspecified: Secondary | ICD-10-CM

## 2019-01-14 DIAGNOSIS — Z141 Cystic fibrosis carrier: Secondary | ICD-10-CM | POA: Diagnosis present

## 2019-01-14 DIAGNOSIS — O99343 Other mental disorders complicating pregnancy, third trimester: Secondary | ICD-10-CM | POA: Diagnosis not present

## 2019-01-14 DIAGNOSIS — O98219 Gonorrhea complicating pregnancy, unspecified trimester: Secondary | ICD-10-CM | POA: Insufficient documentation

## 2019-01-14 DIAGNOSIS — O359XX0 Maternal care for (suspected) fetal abnormality and damage, unspecified, not applicable or unspecified: Secondary | ICD-10-CM

## 2019-01-14 DIAGNOSIS — O09899 Supervision of other high risk pregnancies, unspecified trimester: Secondary | ICD-10-CM

## 2019-01-14 DIAGNOSIS — Z3493 Encounter for supervision of normal pregnancy, unspecified, third trimester: Secondary | ICD-10-CM

## 2019-01-14 DIAGNOSIS — O09893 Supervision of other high risk pregnancies, third trimester: Secondary | ICD-10-CM | POA: Diagnosis not present

## 2019-01-14 DIAGNOSIS — Z362 Encounter for other antenatal screening follow-up: Secondary | ICD-10-CM | POA: Diagnosis present

## 2019-01-14 DIAGNOSIS — Z3A32 32 weeks gestation of pregnancy: Secondary | ICD-10-CM

## 2019-01-14 DIAGNOSIS — O99323 Drug use complicating pregnancy, third trimester: Secondary | ICD-10-CM

## 2019-01-14 MED ORDER — FLUCONAZOLE 150 MG PO TABS: 150.0000 mg | ORAL_TABLET | Freq: Once | ORAL | 0 refills | Status: AC

## 2019-01-14 MED ORDER — METFORMIN HCL 500 MG PO TABS: 500.0000 mg | ORAL_TABLET | Freq: Every day | ORAL | 2 refills | Status: DC

## 2019-01-14 NOTE — Progress Notes (Signed)
Subjective:  Caitlin Terrell is a 33 y.o. G2P1001 at [redacted]w[redacted]d being seen today for ongoing prenatal care.  She is currently monitored for the following issues for this high-risk pregnancy and has Supervision of low-risk pregnancy; Bipolar disease in pregnancy ([place]); Asthma; Gonorrhea in pregnancy, antepartum; Cystic fibrosis carrier, antepartum; Echogenic bowel of fetus on prenatal ultrasound; Gestational diabetes; and History of herpes genitalis on their problem list.  GDM: Patient diet controlled.  Fasting: 90-110 2hr PP: controlled  Patient reports vaginal irritation.  Contractions: Not present. Vag. Bleeding: None.  Movement: Present. Denies leaking of fluid.   The following portions of the patient's history were reviewed and updated as appropriate: allergies, current medications, past family history, past medical history, past social history, past surgical history and problem list. Problem list updated.  Objective:   Vitals:   01/14/19 0907  BP: 124/84  Pulse: (!) 113  Weight: 197 lb 11.2 oz (89.7 kg)    Fetal Status: Fetal Heart Rate (bpm): 150   Movement: Present     General:  Alert, oriented and cooperative. Patient is in no acute distress.  Skin: Skin is warm and dry. No rash noted.   Cardiovascular: Normal heart rate noted  Respiratory: Normal respiratory effort, no problems with respiration noted  Abdomen: Soft, gravid, appropriate for gestational age. Pain/Pressure: Present     Pelvic: Vag. Bleeding: None Vag D/C Character: White   Cervical exam deferred        Extremities: Normal range of motion.  Edema: None  Mental Status: Normal mood and affect. Normal behavior. Normal judgment and thought content.   Urinalysis:      Assessment and Plan:  Pregnancy: G2P1001 at [redacted]w[redacted]d  1. Encounter for supervision of low-risk pregnancy in third trimester FHT normal  2. Gestational diabetes mellitus (GDM) in third trimester, gestational diabetes method of control unspecified Start  metformin BPP Growth - [place] MFM FETAL BPP WO NON STRESS; Future  3. Yeast infection diflucan  4. Echogenic bowel of fetus on prenatal ultrasound F/u US today  5. Cystic fibrosis carrier, antepartum   Preterm labor symptoms and general obstetric precautions including but not limited to vaginal bleeding, contractions, leaking of fluid and fetal movement were reviewed in detail with the patient. Please refer to After Visit Summary for other counseling recommendations.  No follow-ups on file.   Truett Mainland, DO

## 2019-01-16 NOTE — L&D Delivery Note (Addendum)
Caitlin Terrell  October 31, 1985 [id number]  34 year old G2P1001 at 37.2 weeks with GDM-A2 on Metformin last dose last night.  GBS unknown.   Patient presented in active labor reporting contractions since Midnight.  Patient arrived on unit at 6-7 cm with bulging bag.  Patient denies history of GBS and AROM performed with moderate amt clear fluid.  Cat I FT remained.  Patient progressed quickly to complete and pushed spontaneously.  Patient delivered as below with staff and family support.   Delivery Note At 11:10 AM, on Feb 16, 2019 a viable female "[name]" was delivered via Vaginal, Spontaneous (Presentation: Right Occiput Anterior with manual restitution to LOT). Shoulders delivered easily and infant with good tone and spontaneous cry after tactile stimulation from provider. Infant placed on mother's abdomen where nurse continued tactile stimulation. Infant APGAR: 9, 9. After 3 minute delay, the umbilical cord was clamped, cut by support person, and blood collected by provider. Placenta delivered spontaneously via [name] and was noted to be intact with 3VC upon inspection.   Vaginal inspection revealed a right periurethral and left labial laceration that was hemostatic and left unrepaired. Fundus firm, at the umbilicus, and bleeding small.  Mother hemodynamically stable and infant skin to skin prior to provider exit.  Mother desires BTL for birth control and papers valid.  She opts to breast and bottle feed.  Infant weight at one hour of life: 7lbs 4.4oz, 20.5in   Anesthesia: None Episiotomy: None Lacerations: Labial;Periurethral Suture Repair: None Est. Blood Loss (mL): 100  Mom to postpartum.  Baby to Couplet care / Skin to Skin.  Cherre Robins, MSN, CNM 02/16/2019, 11:48 AM

## 2019-01-21 ENCOUNTER — Other Ambulatory Visit: Payer: Self-pay

## 2019-01-21 ENCOUNTER — Encounter (HOSPITAL_COMMUNITY): Payer: Self-pay

## 2019-01-21 ENCOUNTER — Ambulatory Visit (HOSPITAL_COMMUNITY): Payer: Medicaid Other | Admitting: *Deleted

## 2019-01-21 ENCOUNTER — Ambulatory Visit (HOSPITAL_COMMUNITY)
Admission: RE | Admit: 2019-01-21 | Discharge: 2019-01-21 | Disposition: A | Discharge status: Home / Self Care | Payer: Medicaid Other | Source: Ambulatory Visit | Attending: Obstetrics and Gynecology | Admitting: Obstetrics and Gynecology

## 2019-01-21 DIAGNOSIS — O359XX0 Maternal care for (suspected) fetal abnormality and damage, unspecified, not applicable or unspecified: Secondary | ICD-10-CM | POA: Diagnosis not present

## 2019-01-21 DIAGNOSIS — O09893 Supervision of other high risk pregnancies, third trimester: Secondary | ICD-10-CM | POA: Diagnosis not present

## 2019-01-21 DIAGNOSIS — O09899 Supervision of other high risk pregnancies, unspecified trimester: Secondary | ICD-10-CM

## 2019-01-21 DIAGNOSIS — O283 Abnormal ultrasonic finding on antenatal screening of mother: Secondary | ICD-10-CM

## 2019-01-21 DIAGNOSIS — O98219 Gonorrhea complicating pregnancy, unspecified trimester: Secondary | ICD-10-CM

## 2019-01-21 DIAGNOSIS — O24415 Gestational diabetes mellitus in pregnancy, controlled by oral hypoglycemic drugs: Secondary | ICD-10-CM | POA: Insufficient documentation

## 2019-01-21 DIAGNOSIS — O99343 Other mental disorders complicating pregnancy, third trimester: Secondary | ICD-10-CM | POA: Diagnosis not present

## 2019-01-21 DIAGNOSIS — Z141 Cystic fibrosis carrier: Secondary | ICD-10-CM | POA: Insufficient documentation

## 2019-01-21 DIAGNOSIS — F319 Bipolar disorder, unspecified: Secondary | ICD-10-CM

## 2019-01-21 DIAGNOSIS — B009 Herpesviral infection, unspecified: Secondary | ICD-10-CM

## 2019-01-21 DIAGNOSIS — Z3A33 33 weeks gestation of pregnancy: Secondary | ICD-10-CM

## 2019-01-21 DIAGNOSIS — O98513 Other viral diseases complicating pregnancy, third trimester: Secondary | ICD-10-CM

## 2019-01-28 ENCOUNTER — Encounter: Payer: Self-pay | Admitting: Obstetrics and Gynecology

## 2019-01-28 ENCOUNTER — Encounter (HOSPITAL_COMMUNITY): Payer: Self-pay

## 2019-01-28 ENCOUNTER — Ambulatory Visit (INDEPENDENT_AMBULATORY_CARE_PROVIDER_SITE_OTHER): Payer: Medicaid Other | Admitting: Obstetrics and Gynecology

## 2019-01-28 ENCOUNTER — Ambulatory Visit (HOSPITAL_COMMUNITY): Payer: Medicaid Other

## 2019-01-28 ENCOUNTER — Ambulatory Visit (HOSPITAL_COMMUNITY)
Admission: RE | Admit: 2019-01-28 | Discharge: 2019-01-28 | Disposition: A | Discharge status: Home / Self Care | Payer: Medicaid Other | Source: Ambulatory Visit | Attending: Obstetrics | Admitting: Obstetrics

## 2019-01-28 ENCOUNTER — Ambulatory Visit (HOSPITAL_COMMUNITY): Admission: RE | Admit: 2019-01-28 | Payer: Medicaid Other | Source: Ambulatory Visit

## 2019-01-28 ENCOUNTER — Ambulatory Visit (HOSPITAL_COMMUNITY): Payer: Medicaid Other | Admitting: *Deleted

## 2019-01-28 ENCOUNTER — Other Ambulatory Visit: Payer: Self-pay

## 2019-01-28 VITALS — BP 128/79 | HR 106 | Wt 202.1 lb

## 2019-01-28 DIAGNOSIS — O98219 Gonorrhea complicating pregnancy, unspecified trimester: Secondary | ICD-10-CM

## 2019-01-28 DIAGNOSIS — O24410 Gestational diabetes mellitus in pregnancy, diet controlled: Secondary | ICD-10-CM | POA: Diagnosis present

## 2019-01-28 DIAGNOSIS — O283 Abnormal ultrasonic finding on antenatal screening of mother: Secondary | ICD-10-CM | POA: Diagnosis present

## 2019-01-28 DIAGNOSIS — O0990 Supervision of high risk pregnancy, unspecified, unspecified trimester: Secondary | ICD-10-CM

## 2019-01-28 DIAGNOSIS — O09899 Supervision of other high risk pregnancies, unspecified trimester: Secondary | ICD-10-CM | POA: Insufficient documentation

## 2019-01-28 DIAGNOSIS — O24415 Gestational diabetes mellitus in pregnancy, controlled by oral hypoglycemic drugs: Secondary | ICD-10-CM | POA: Diagnosis not present

## 2019-01-28 DIAGNOSIS — O09893 Supervision of other high risk pregnancies, third trimester: Secondary | ICD-10-CM

## 2019-01-28 DIAGNOSIS — F319 Bipolar disorder, unspecified: Secondary | ICD-10-CM

## 2019-01-28 DIAGNOSIS — B009 Herpesviral infection, unspecified: Secondary | ICD-10-CM

## 2019-01-28 DIAGNOSIS — O99343 Other mental disorders complicating pregnancy, third trimester: Secondary | ICD-10-CM | POA: Diagnosis not present

## 2019-01-28 DIAGNOSIS — O359XX0 Maternal care for (suspected) fetal abnormality and damage, unspecified, not applicable or unspecified: Secondary | ICD-10-CM | POA: Diagnosis not present

## 2019-01-28 DIAGNOSIS — Z141 Cystic fibrosis carrier: Secondary | ICD-10-CM | POA: Insufficient documentation

## 2019-01-28 DIAGNOSIS — Z3A34 34 weeks gestation of pregnancy: Secondary | ICD-10-CM

## 2019-01-28 DIAGNOSIS — O98513 Other viral diseases complicating pregnancy, third trimester: Secondary | ICD-10-CM

## 2019-01-28 DIAGNOSIS — Z3009 Encounter for other general counseling and advice on contraception: Secondary | ICD-10-CM | POA: Insufficient documentation

## 2019-01-28 DIAGNOSIS — Z8619 Personal history of other infectious and parasitic diseases: Secondary | ICD-10-CM

## 2019-01-28 DIAGNOSIS — O0993 Supervision of high risk pregnancy, unspecified, third trimester: Secondary | ICD-10-CM

## 2019-01-28 NOTE — Patient Instructions (Signed)
Third Trimester of Pregnancy The third trimester is from week 28 through week 40 (months 7 through 9). The third trimester is a time when the unborn baby (fetus) is growing rapidly. At the end of the ninth month, the fetus is about 20 inches in length and weighs 6-10 pounds. Body changes during your third trimester Your body will continue to go through many changes during pregnancy. The changes vary from woman to woman. During the third trimester:  Your weight will continue to increase. You can expect to gain 25-35 pounds (11-16 kg) by the end of the pregnancy.  You may begin to get stretch marks on your hips, abdomen, and breasts.  You may urinate more often because the fetus is moving lower into your pelvis and pressing on your bladder.  You may develop or continue to have heartburn. This is caused by increased hormones that slow down muscles in the digestive tract.  You may develop or continue to have constipation because increased hormones slow digestion and cause the muscles that push waste through your intestines to relax.  You may develop hemorrhoids. These are swollen veins (varicose veins) in the rectum that can itch or be painful.  You may develop swollen, bulging veins (varicose veins) in your legs.  You may have increased body aches in the pelvis, back, or thighs. This is due to weight gain and increased hormones that are relaxing your joints.  You may have changes in your hair. These can include thickening of your hair, rapid growth, and changes in texture. Some women also have hair loss during or after pregnancy, or hair that feels dry or thin. Your hair will most likely return to normal after your baby is born.  Your breasts will continue to grow and they will continue to become tender. A yellow fluid (colostrum) may leak from your breasts. This is the first milk you are producing for your baby.  Your belly button may stick out.  You may notice more swelling in your hands,  face, or ankles.  You may have increased tingling or numbness in your hands, arms, and legs. The skin on your belly may also feel numb.  You may feel short of breath because of your expanding uterus.  You may have more problems sleeping. This can be caused by the size of your belly, increased need to urinate, and an increase in your body's metabolism.  You may notice the fetus "dropping," or moving lower in your abdomen (lightening).  You may have increased vaginal discharge.  You may notice your joints feel loose and you may have pain around your pelvic bone. What to expect at prenatal visits You will have prenatal exams every 2 weeks until week 36. Then you will have weekly prenatal exams. During a routine prenatal visit:  You will be weighed to make sure you and the baby are growing normally.  Your blood pressure will be taken.  Your abdomen will be measured to track your baby's growth.  The fetal heartbeat will be listened to.  Any test results from the previous visit will be discussed.  You may have a cervical check near your due date to see if your cervix has softened or thinned (effaced).  You will be tested for Group B streptococcus. This happens between 35 and 37 weeks. Your health care provider may ask you:  What your birth plan is.  How you are feeling.  If you are feeling the baby move.  If you have had any abnormal   symptoms, such as leaking fluid, bleeding, severe headaches, or abdominal cramping.  If you are using any tobacco products, including cigarettes, chewing tobacco, and electronic cigarettes.  If you have any questions. Other tests or screenings that may be performed during your third trimester include:  Blood tests that check for low iron levels (anemia).  Fetal testing to check the health, activity level, and growth of the fetus. Testing is done if you have certain medical conditions or if there are problems during the pregnancy.  Nonstress test  (NST). This test checks the health of your baby to make sure there are no signs of problems, such as the baby not getting enough oxygen. During this test, a belt is placed around your belly. The baby is made to move, and its heart rate is monitored during movement. What is false labor? False labor is a condition in which you feel small, irregular tightenings of the muscles in the womb (contractions) that usually go away with rest, changing position, or drinking water. These are called Braxton Hicks contractions. Contractions may last for hours, days, or even weeks before true labor sets in. If contractions come at regular intervals, become more frequent, increase in intensity, or become painful, you should see your health care provider. What are the signs of labor?  Abdominal cramps.  Regular contractions that start at 10 minutes apart and become stronger and more frequent with time.  Contractions that start on the top of the uterus and spread down to the lower abdomen and back.  Increased pelvic pressure and dull back pain.  A watery or bloody mucus discharge that comes from the vagina.  Leaking of amniotic fluid. This is also known as your "water breaking." It could be a slow trickle or a gush. Let your health care provider know if it has a color or strange odor. If you have any of these signs, call your health care provider right away, even if it is before your due date. Follow these instructions at home: Medicines  Follow your health care provider's instructions regarding medicine use. Specific medicines may be either safe or unsafe to take during pregnancy.  Take a prenatal vitamin that contains at least 600 micrograms (mcg) of folic acid.  If you develop constipation, try taking a stool softener if your health care provider approves. Eating and drinking   Eat a balanced diet that includes fresh fruits and vegetables, whole grains, good sources of protein such as meat, eggs, or tofu,  and low-fat dairy. Your health care provider will help you determine the amount of weight gain that is right for you.  Avoid raw meat and uncooked cheese. These carry germs that can cause birth defects in the baby.  If you have low calcium intake from food, talk to your health care provider about whether you should take a daily calcium supplement.  Eat four or five small meals rather than three large meals a day.  Limit foods that are high in fat and processed sugars, such as fried and sweet foods.  To prevent constipation: ? Drink enough fluid to keep your urine clear or pale yellow. ? Eat foods that are high in fiber, such as fresh fruits and vegetables, whole grains, and beans. Activity  Exercise only as directed by your health care provider. Most women can continue their usual exercise routine during pregnancy. Try to exercise for 30 minutes at least 5 days a week. Stop exercising if you experience uterine contractions.  Avoid heavy lifting.  Do   not exercise in extreme heat or humidity, or at high altitudes.  Wear low-heel, comfortable shoes.  Practice good posture.  You may continue to have sex unless your health care provider tells you otherwise. Relieving pain and discomfort  Take frequent breaks and rest with your legs elevated if you have leg cramps or low back pain.  Take warm sitz baths to soothe any pain or discomfort caused by hemorrhoids. Use hemorrhoid cream if your health care provider approves.  Wear a good support bra to prevent discomfort from breast tenderness.  If you develop varicose veins: ? Wear support pantyhose or compression stockings as told by your healthcare provider. ? Elevate your feet for 15 minutes, 3-4 times a day. Prenatal care  Write down your questions. Take them to your prenatal visits.  Keep all your prenatal visits as told by your health care provider. This is important. Safety  Wear your seat belt at all times when driving.  Make  a list of emergency phone numbers, including numbers for family, friends, the hospital, and police and fire departments. General instructions  Avoid cat litter boxes and soil used by cats. These carry germs that can cause birth defects in the baby. If you have a cat, ask someone to clean the litter box for you.  Do not travel far distances unless it is absolutely necessary and only with the approval of your health care provider.  Do not use hot tubs, steam rooms, or saunas.  Do not drink alcohol.  Do not use any products that contain nicotine or tobacco, such as cigarettes and e-cigarettes. If you need help quitting, ask your health care provider.  Do not use any medicinal herbs or unprescribed drugs. These chemicals affect the formation and growth of the baby.  Do not douche or use tampons or scented sanitary pads.  Do not cross your legs for long periods of time.  To prepare for the arrival of your baby: ? Take prenatal classes to understand, practice, and ask questions about labor and delivery. ? Make a trial run to the hospital. ? Visit the hospital and tour the maternity area. ? Arrange for maternity or paternity leave through employers. ? Arrange for family and friends to take care of pets while you are in the hospital. ? Purchase a rear-facing car seat and make sure you know how to install it in your car. ? Pack your hospital bag. ? Prepare the baby's nursery. Make sure to remove all pillows and stuffed animals from the baby's crib to prevent suffocation.  Visit your dentist if you have not gone during your pregnancy. Use a soft toothbrush to brush your teeth and be gentle when you floss. Contact a health care provider if:  You are unsure if you are in labor or if your water has broken.  You become dizzy.  You have mild pelvic cramps, pelvic pressure, or nagging pain in your abdominal area.  You have lower back pain.  You have persistent nausea, vomiting, or  diarrhea.  You have an unusual or bad smelling vaginal discharge.  You have pain when you urinate. Get help right away if:  Your water breaks before 37 weeks.  You have regular contractions less than 5 minutes apart before 37 weeks.  You have a fever.  You are leaking fluid from your vagina.  You have spotting or bleeding from your vagina.  You have severe abdominal pain or cramping.  You have rapid weight loss or weight gain.  You have   shortness of breath with chest pain.  You notice sudden or extreme swelling of your face, hands, ankles, feet, or legs.  Your baby makes fewer than 10 movements in 2 hours.  You have severe headaches that do not go away when you take medicine.  You have vision changes. Summary  The third trimester is from week 28 through week 40, months 7 through 9. The third trimester is a time when the unborn baby (fetus) is growing rapidly.  During the third trimester, your discomfort may increase as you and your baby continue to gain weight. You may have abdominal, leg, and back pain, sleeping problems, and an increased need to urinate.  During the third trimester your breasts will keep growing and they will continue to become tender. A yellow fluid (colostrum) may leak from your breasts. This is the first milk you are producing for your baby.  False labor is a condition in which you feel small, irregular tightenings of the muscles in the womb (contractions) that eventually go away. These are called Braxton Hicks contractions. Contractions may last for hours, days, or even weeks before true labor sets in.  Signs of labor can include: abdominal cramps; regular contractions that start at 10 minutes apart and become stronger and more frequent with time; watery or bloody mucus discharge that comes from the vagina; increased pelvic pressure and dull back pain; and leaking of amniotic fluid. This information is not intended to replace advice given to you by your  health care provider. Make sure you discuss any questions you have with your health care provider. Document Revised: 04/24/2018 Document Reviewed: 02/07/2016 [employer] Patient Education  2020 [employer]  

## 2019-01-28 NOTE — Progress Notes (Signed)
Pt records Glucose levels on Paper.

## 2019-01-28 NOTE — Progress Notes (Signed)
Subjective:  Caitlin Terrell is a 34 y.o. G2P1001 at [redacted]w[redacted]d being seen today for ongoing prenatal care.  She is currently monitored for the following issues for this high-risk pregnancy and has Supervision of high risk pregnancy, antepartum; Bipolar disease in pregnancy (HCC); Asthma; Gonorrhea in pregnancy, antepartum; Cystic fibrosis carrier, antepartum; Echogenic bowel of fetus on prenatal ultrasound; Gestational diabetes; History of herpes genitalis; and Unwanted fertility on their problem list.  Patient reports general discomforts of pregnancy.  Contractions: Not present. Vag. Bleeding: None.  Movement: Present. Denies leaking of fluid.   The following portions of the patient's history were reviewed and updated as appropriate: allergies, current medications, past family history, past medical history, past social history, past surgical history and problem list. Problem list updated.  Objective:   Vitals:   01/28/19 0856  BP: 128/79  Pulse: (!) 106  Weight: 202 lb 1.6 oz (91.7 kg)    Fetal Status: Fetal Heart Rate (bpm): 156   Movement: Present     General:  Alert, oriented and cooperative. Patient is in no acute distress.  Skin: Skin is warm and dry. No rash noted.   Cardiovascular: Normal heart rate noted  Respiratory: Normal respiratory effort, no problems with respiration noted  Abdomen: Soft, gravid, appropriate for gestational age. Pain/Pressure: Present     Pelvic:  Cervical exam deferred        Extremities: Normal range of motion.  Edema: None  Mental Status: Normal mood and affect. Normal behavior. Normal judgment and thought content.   Urinalysis:      Assessment and Plan:  Pregnancy: G2P1001 at [redacted]w[redacted]d  1. Supervision of high risk pregnancy, antepartum Stable GBS and vaginal cultures.  2. Gestational diabetes mellitus (GDM) in third trimester controlled on oral hypoglycemic drug CBG's in goal range with Metformin Continue with weekly BPP and Metformin IOL 39-40 weeks,  reviewed with pt  3. Cystic fibrosis carrier, antepartum Stable  4. Unwanted fertility BTL papers signed  5. Echogenic bowel of fetus on prenatal ultrasound Not seen on last U/S LR NIPS  6. History of herpes genitalis Suppression starting at 36 weeks  Preterm labor symptoms and general obstetric precautions including but not limited to vaginal bleeding, contractions, leaking of fluid and fetal movement were reviewed in detail with the patient. Please refer to After Visit Summary for other counseling recommendations.  Return in about 2 weeks (around 02/11/2019) for OB visit, face to face, GBS, MD.   Hermina Staggers, MD

## 2019-01-29 ENCOUNTER — Encounter: Payer: Medicaid Other | Admitting: Obstetrics and Gynecology

## 2019-02-02 NOTE — Progress Notes (Signed)
f °

## 2019-02-04 ENCOUNTER — Other Ambulatory Visit: Payer: Self-pay

## 2019-02-04 ENCOUNTER — Ambulatory Visit (HOSPITAL_COMMUNITY)
Admission: RE | Admit: 2019-02-04 | Discharge: 2019-02-04 | Disposition: A | Discharge status: Home / Self Care | Payer: Medicaid Other | Source: Ambulatory Visit | Attending: Obstetrics and Gynecology | Admitting: Obstetrics and Gynecology

## 2019-02-04 ENCOUNTER — Ambulatory Visit (HOSPITAL_COMMUNITY): Payer: Medicaid Other | Admitting: *Deleted

## 2019-02-04 ENCOUNTER — Encounter (HOSPITAL_COMMUNITY): Payer: Self-pay

## 2019-02-04 DIAGNOSIS — O09899 Supervision of other high risk pregnancies, unspecified trimester: Secondary | ICD-10-CM | POA: Insufficient documentation

## 2019-02-04 DIAGNOSIS — O99343 Other mental disorders complicating pregnancy, third trimester: Secondary | ICD-10-CM

## 2019-02-04 DIAGNOSIS — O283 Abnormal ultrasonic finding on antenatal screening of mother: Secondary | ICD-10-CM | POA: Insufficient documentation

## 2019-02-04 DIAGNOSIS — O359XX0 Maternal care for (suspected) fetal abnormality and damage, unspecified, not applicable or unspecified: Secondary | ICD-10-CM | POA: Diagnosis not present

## 2019-02-04 DIAGNOSIS — O98219 Gonorrhea complicating pregnancy, unspecified trimester: Secondary | ICD-10-CM | POA: Diagnosis present

## 2019-02-04 DIAGNOSIS — O24415 Gestational diabetes mellitus in pregnancy, controlled by oral hypoglycemic drugs: Secondary | ICD-10-CM | POA: Diagnosis present

## 2019-02-04 DIAGNOSIS — Z141 Cystic fibrosis carrier: Secondary | ICD-10-CM | POA: Insufficient documentation

## 2019-02-04 DIAGNOSIS — O0990 Supervision of high risk pregnancy, unspecified, unspecified trimester: Secondary | ICD-10-CM

## 2019-02-04 DIAGNOSIS — O98513 Other viral diseases complicating pregnancy, third trimester: Secondary | ICD-10-CM

## 2019-02-04 DIAGNOSIS — B009 Herpesviral infection, unspecified: Secondary | ICD-10-CM

## 2019-02-04 DIAGNOSIS — O09893 Supervision of other high risk pregnancies, third trimester: Secondary | ICD-10-CM

## 2019-02-04 DIAGNOSIS — F319 Bipolar disorder, unspecified: Secondary | ICD-10-CM

## 2019-02-04 DIAGNOSIS — Z3A35 35 weeks gestation of pregnancy: Secondary | ICD-10-CM

## 2019-02-07 ENCOUNTER — Encounter (HOSPITAL_COMMUNITY): Payer: Self-pay | Admitting: Psychiatry

## 2019-02-07 ENCOUNTER — Other Ambulatory Visit: Payer: Self-pay

## 2019-02-07 ENCOUNTER — Ambulatory Visit (INDEPENDENT_AMBULATORY_CARE_PROVIDER_SITE_OTHER): Payer: Medicaid Other | Admitting: Psychiatry

## 2019-02-07 DIAGNOSIS — F411 Generalized anxiety disorder: Secondary | ICD-10-CM

## 2019-02-07 DIAGNOSIS — F39 Unspecified mood [affective] disorder: Secondary | ICD-10-CM | POA: Diagnosis not present

## 2019-02-07 DIAGNOSIS — F0630 Mood disorder due to known physiological condition, unspecified: Secondary | ICD-10-CM

## 2019-02-07 DIAGNOSIS — F331 Major depressive disorder, recurrent, moderate: Secondary | ICD-10-CM

## 2019-02-07 MED ORDER — ESCITALOPRAM OXALATE 5 MG PO TABS: 5.0000 mg | ORAL_TABLET | Freq: Every day | ORAL | 0 refills | Status: DC
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
## 2019-02-11 ENCOUNTER — Ambulatory Visit (HOSPITAL_COMMUNITY): Payer: Medicaid Other

## 2019-02-11 ENCOUNTER — Ambulatory Visit (HOSPITAL_COMMUNITY)
Admission: RE | Admit: 2019-02-11 | Discharge: 2019-02-11 | Disposition: A | Discharge status: Home / Self Care | Payer: Medicaid Other | Source: Ambulatory Visit | Attending: Obstetrics | Admitting: Obstetrics

## 2019-02-11 ENCOUNTER — Ambulatory Visit (HOSPITAL_COMMUNITY): Payer: Medicaid Other | Admitting: *Deleted

## 2019-02-11 ENCOUNTER — Encounter (HOSPITAL_COMMUNITY): Payer: Self-pay

## 2019-02-11 ENCOUNTER — Other Ambulatory Visit: Payer: Self-pay

## 2019-02-11 ENCOUNTER — Other Ambulatory Visit (HOSPITAL_COMMUNITY): Payer: Self-pay | Admitting: *Deleted

## 2019-02-11 DIAGNOSIS — O98219 Gonorrhea complicating pregnancy, unspecified trimester: Secondary | ICD-10-CM | POA: Diagnosis present

## 2019-02-11 DIAGNOSIS — O24415 Gestational diabetes mellitus in pregnancy, controlled by oral hypoglycemic drugs: Secondary | ICD-10-CM

## 2019-02-11 DIAGNOSIS — O0990 Supervision of high risk pregnancy, unspecified, unspecified trimester: Secondary | ICD-10-CM | POA: Diagnosis present

## 2019-02-11 DIAGNOSIS — O283 Abnormal ultrasonic finding on antenatal screening of mother: Secondary | ICD-10-CM

## 2019-02-11 DIAGNOSIS — Z362 Encounter for other antenatal screening follow-up: Secondary | ICD-10-CM

## 2019-02-11 DIAGNOSIS — F319 Bipolar disorder, unspecified: Secondary | ICD-10-CM

## 2019-02-11 DIAGNOSIS — B009 Herpesviral infection, unspecified: Secondary | ICD-10-CM

## 2019-02-11 DIAGNOSIS — O09893 Supervision of other high risk pregnancies, third trimester: Secondary | ICD-10-CM | POA: Diagnosis not present

## 2019-02-11 DIAGNOSIS — O98513 Other viral diseases complicating pregnancy, third trimester: Secondary | ICD-10-CM

## 2019-02-11 DIAGNOSIS — O09899 Supervision of other high risk pregnancies, unspecified trimester: Secondary | ICD-10-CM

## 2019-02-11 DIAGNOSIS — O359XX0 Maternal care for (suspected) fetal abnormality and damage, unspecified, not applicable or unspecified: Secondary | ICD-10-CM

## 2019-02-11 DIAGNOSIS — Z141 Cystic fibrosis carrier: Secondary | ICD-10-CM | POA: Diagnosis present

## 2019-02-11 DIAGNOSIS — O99343 Other mental disorders complicating pregnancy, third trimester: Secondary | ICD-10-CM

## 2019-02-11 DIAGNOSIS — Z3A36 36 weeks gestation of pregnancy: Secondary | ICD-10-CM

## 2019-02-12 ENCOUNTER — Encounter: Payer: Medicaid Other | Admitting: Obstetrics and Gynecology

## 2019-02-16 ENCOUNTER — Encounter (HOSPITAL_COMMUNITY)
Admission: AD | Disposition: A | Discharge status: Home / Self Care | Payer: Self-pay | Source: Home / Self Care | Attending: Obstetrics & Gynecology

## 2019-02-16 ENCOUNTER — Inpatient Hospital Stay (HOSPITAL_COMMUNITY): Payer: Medicaid Other | Admitting: Anesthesiology

## 2019-02-16 ENCOUNTER — Encounter: Payer: Medicaid Other | Admitting: Family Medicine

## 2019-02-16 ENCOUNTER — Inpatient Hospital Stay (HOSPITAL_COMMUNITY)
Admission: AD | Admit: 2019-02-16 | Discharge: 2019-02-17 | DRG: 798 | Disposition: A | Discharge status: Home / Self Care | Payer: Medicaid Other | Attending: Obstetrics & Gynecology | Admitting: Obstetrics & Gynecology

## 2019-02-16 ENCOUNTER — Other Ambulatory Visit: Payer: Self-pay

## 2019-02-16 ENCOUNTER — Encounter (HOSPITAL_COMMUNITY): Payer: Self-pay | Admitting: Obstetrics & Gynecology

## 2019-02-16 DIAGNOSIS — O98219 Gonorrhea complicating pregnancy, unspecified trimester: Secondary | ICD-10-CM

## 2019-02-16 DIAGNOSIS — Z141 Cystic fibrosis carrier: Secondary | ICD-10-CM | POA: Diagnosis not present

## 2019-02-16 DIAGNOSIS — O9952 Diseases of the respiratory system complicating childbirth: Secondary | ICD-10-CM | POA: Diagnosis present

## 2019-02-16 DIAGNOSIS — O24425 Gestational diabetes mellitus in childbirth, controlled by oral hypoglycemic drugs: Secondary | ICD-10-CM | POA: Diagnosis present

## 2019-02-16 DIAGNOSIS — O283 Abnormal ultrasonic finding on antenatal screening of mother: Secondary | ICD-10-CM | POA: Diagnosis present

## 2019-02-16 DIAGNOSIS — O24415 Gestational diabetes mellitus in pregnancy, controlled by oral hypoglycemic drugs: Secondary | ICD-10-CM

## 2019-02-16 DIAGNOSIS — Z302 Encounter for sterilization: Secondary | ICD-10-CM

## 2019-02-16 DIAGNOSIS — O0990 Supervision of high risk pregnancy, unspecified, unspecified trimester: Secondary | ICD-10-CM

## 2019-02-16 DIAGNOSIS — Z8619 Personal history of other infectious and parasitic diseases: Secondary | ICD-10-CM | POA: Diagnosis present

## 2019-02-16 DIAGNOSIS — Z3A37 37 weeks gestation of pregnancy: Secondary | ICD-10-CM

## 2019-02-16 DIAGNOSIS — O24424 Gestational diabetes mellitus in childbirth, insulin controlled: Secondary | ICD-10-CM | POA: Diagnosis present

## 2019-02-16 DIAGNOSIS — O09899 Supervision of other high risk pregnancies, unspecified trimester: Secondary | ICD-10-CM

## 2019-02-16 DIAGNOSIS — Z3009 Encounter for other general counseling and advice on contraception: Secondary | ICD-10-CM | POA: Diagnosis present

## 2019-02-16 DIAGNOSIS — F319 Bipolar disorder, unspecified: Secondary | ICD-10-CM | POA: Diagnosis present

## 2019-02-16 DIAGNOSIS — O99344 Other mental disorders complicating childbirth: Secondary | ICD-10-CM | POA: Diagnosis present

## 2019-02-16 DIAGNOSIS — J45909 Unspecified asthma, uncomplicated: Secondary | ICD-10-CM | POA: Diagnosis present

## 2019-02-16 DIAGNOSIS — O24419 Gestational diabetes mellitus in pregnancy, unspecified control: Secondary | ICD-10-CM | POA: Diagnosis present

## 2019-02-16 DIAGNOSIS — Z20822 Contact with and (suspected) exposure to covid-19: Secondary | ICD-10-CM | POA: Diagnosis present

## 2019-02-16 DIAGNOSIS — O26893 Other specified pregnancy related conditions, third trimester: Secondary | ICD-10-CM | POA: Diagnosis present

## 2019-02-16 DIAGNOSIS — Z9851 Tubal ligation status: Secondary | ICD-10-CM

## 2019-02-16 HISTORY — PX: TUBAL LIGATION: SHX77

## 2019-02-16 LAB — CBC
HCT: 41.1 % (ref 36.0–46.0)
Hemoglobin: 13.9 g/dL (ref 12.0–15.0)
MCH: 31 pg (ref 26.0–34.0)
MCHC: 33.8 g/dL (ref 30.0–36.0)
MCV: 91.5 fL (ref 80.0–100.0)
Platelets: 213 10*3/uL (ref 150–400)
RBC: 4.49 MIL/uL (ref 3.87–5.11)
RDW: 14.6 % (ref 11.5–15.5)
WBC: 8.6 10*3/uL (ref 4.0–10.5)
nRBC: 0 % (ref 0.0–0.2)

## 2019-02-16 LAB — RESPIRATORY PANEL BY RT PCR (FLU A&B, COVID)
Influenza A by PCR: NEGATIVE
Influenza B by PCR: NEGATIVE
SARS Coronavirus 2 by RT PCR: NEGATIVE

## 2019-02-16 LAB — TYPE AND SCREEN
ABO/RH(D): O POS
Antibody Screen: NEGATIVE

## 2019-02-16 LAB — ABO/RH: ABO/RH(D): O POS

## 2019-02-16 LAB — GLUCOSE, CAPILLARY: Glucose-Capillary: 89 mg/dL (ref 70–99)

## 2019-02-16 SURGERY — LIGATION, FALLOPIAN TUBE, POSTPARTUM
Anesthesia: Spinal | Site: Abdomen | Wound class: Clean Contaminated

## 2019-02-16 MED ORDER — ONDANSETRON HCL 4 MG/2ML IJ SOLN: 4.0000 mg | INTRAMUSCULAR | Status: DC | PRN

## 2019-02-16 MED ORDER — PRENATAL MULTIVITAMIN CH
1.0000 | ORAL_TABLET | Freq: Every day | ORAL | Status: DC
  Administered 2019-02-17: 12:00:00 1 via ORAL
  Filled 2019-02-16: qty 1

## 2019-02-16 MED ORDER — DIBUCAINE (PERIANAL) 1 % EX OINT: 1.0000 "application " | TOPICAL_OINTMENT | CUTANEOUS | Status: DC | PRN

## 2019-02-16 MED ORDER — LACTATED RINGERS IV SOLN: INTRAVENOUS | Status: DC

## 2019-02-16 MED ORDER — SOD CITRATE-CITRIC ACID 500-334 MG/5ML PO SOLN: 30.0000 mL | ORAL | Status: DC | PRN

## 2019-02-16 MED ORDER — STERILE WATER FOR IRRIGATION IR SOLN
Status: DC | PRN
  Administered 2019-02-16: 1

## 2019-02-16 MED ORDER — KETOROLAC TROMETHAMINE 30 MG/ML IJ SOLN
INTRAMUSCULAR | Status: AC
  Filled 2019-02-16: qty 1

## 2019-02-16 MED ORDER — WITCH HAZEL-GLYCERIN EX PADS: 1.0000 "application " | MEDICATED_PAD | CUTANEOUS | Status: DC | PRN

## 2019-02-16 MED ORDER — SENNOSIDES-DOCUSATE SODIUM 8.6-50 MG PO TABS
2.0000 | ORAL_TABLET | ORAL | Status: DC
  Administered 2019-02-16: 22:00:00 2 via ORAL
  Filled 2019-02-16: qty 2

## 2019-02-16 MED ORDER — ONDANSETRON HCL 4 MG PO TABS: 4.0000 mg | ORAL_TABLET | ORAL | Status: DC | PRN

## 2019-02-16 MED ORDER — TETANUS-DIPHTH-ACELL PERTUSSIS 5-2.5-18.5 LF-MCG/0.5 IM SUSP: 0.5000 mL | Freq: Once | INTRAMUSCULAR | Status: DC

## 2019-02-16 MED ORDER — COCONUT OIL OIL: 1.0000 "application " | TOPICAL_OIL | Status: DC | PRN

## 2019-02-16 MED ORDER — OXYCODONE HCL 5 MG PO TABS: 5.0000 mg | ORAL_TABLET | Freq: Once | ORAL | Status: DC | PRN

## 2019-02-16 MED ORDER — OXYTOCIN 40 UNITS IN NORMAL SALINE INFUSION - SIMPLE MED
2.5000 [IU]/h | INTRAVENOUS | Status: DC
  Administered 2019-02-16: 2.5 [IU]/h via INTRAVENOUS
  Filled 2019-02-16: qty 1000

## 2019-02-16 MED ORDER — ACETAMINOPHEN 325 MG PO TABS: 650.0000 mg | ORAL_TABLET | ORAL | Status: DC | PRN

## 2019-02-16 MED ORDER — ONDANSETRON HCL 4 MG/2ML IJ SOLN
INTRAMUSCULAR | Status: DC | PRN
  Administered 2019-02-16: 4 mg via INTRAVENOUS

## 2019-02-16 MED ORDER — SIMETHICONE 80 MG PO CHEW: 80.0000 mg | CHEWABLE_TABLET | ORAL | Status: DC | PRN

## 2019-02-16 MED ORDER — OXYCODONE-ACETAMINOPHEN 5-325 MG PO TABS: 1.0000 | ORAL_TABLET | ORAL | Status: DC | PRN

## 2019-02-16 MED ORDER — KETOROLAC TROMETHAMINE 30 MG/ML IJ SOLN
30.0000 mg | Freq: Once | INTRAMUSCULAR | Status: AC | PRN
  Administered 2019-02-16: 16:00:00 30 mg via INTRAVENOUS

## 2019-02-16 MED ORDER — MIDAZOLAM HCL 2 MG/2ML IJ SOLN
INTRAMUSCULAR | Status: AC
  Filled 2019-02-16: qty 2

## 2019-02-16 MED ORDER — MIDAZOLAM HCL 5 MG/5ML IJ SOLN
INTRAMUSCULAR | Status: DC | PRN
  Administered 2019-02-16 (×2): 1 mg via INTRAVENOUS

## 2019-02-16 MED ORDER — ESCITALOPRAM OXALATE 10 MG PO TABS
5.0000 mg | ORAL_TABLET | Freq: Every day | ORAL | Status: DC
  Filled 2019-02-16 (×2): qty 1

## 2019-02-16 MED ORDER — BUPIVACAINE IN DEXTROSE 0.75-8.25 % IT SOLN
INTRATHECAL | Status: DC | PRN
  Administered 2019-02-16: 10.5 mg via INTRATHECAL

## 2019-02-16 MED ORDER — DIPHENHYDRAMINE HCL 25 MG PO CAPS: 25.0000 mg | ORAL_CAPSULE | Freq: Four times a day (QID) | ORAL | Status: DC | PRN

## 2019-02-16 MED ORDER — ONDANSETRON HCL 4 MG/2ML IJ SOLN
INTRAMUSCULAR | Status: AC
  Filled 2019-02-16: qty 2

## 2019-02-16 MED ORDER — BUPIVACAINE HCL (PF) 0.25 % IJ SOLN
INTRAMUSCULAR | Status: DC | PRN
  Administered 2019-02-16: 10 mL

## 2019-02-16 MED ORDER — ONDANSETRON HCL 4 MG/2ML IJ SOLN: 4.0000 mg | Freq: Four times a day (QID) | INTRAMUSCULAR | Status: DC | PRN

## 2019-02-16 MED ORDER — ZOLPIDEM TARTRATE 5 MG PO TABS: 5.0000 mg | ORAL_TABLET | Freq: Every evening | ORAL | Status: DC | PRN

## 2019-02-16 MED ORDER — HYDROMORPHONE HCL 1 MG/ML IJ SOLN: 0.2500 mg | INTRAMUSCULAR | Status: DC | PRN

## 2019-02-16 MED ORDER — BUPIVACAINE HCL (PF) 0.25 % IJ SOLN
INTRAMUSCULAR | Status: AC
  Filled 2019-02-16: qty 20

## 2019-02-16 MED ORDER — LACTATED RINGERS IV SOLN: 500.0000 mL | INTRAVENOUS | Status: DC | PRN

## 2019-02-16 MED ORDER — OXYCODONE HCL 5 MG/5ML PO SOLN: 5.0000 mg | Freq: Once | ORAL | Status: DC | PRN

## 2019-02-16 MED ORDER — OXYCODONE-ACETAMINOPHEN 5-325 MG PO TABS: 2.0000 | ORAL_TABLET | ORAL | Status: DC | PRN

## 2019-02-16 MED ORDER — LIDOCAINE HCL (PF) 1 % IJ SOLN
30.0000 mL | INTRAMUSCULAR | Status: DC | PRN
  Filled 2019-02-16: qty 30

## 2019-02-16 MED ORDER — SODIUM CHLORIDE 0.9 % IR SOLN
Status: DC | PRN
  Administered 2019-02-16: 1

## 2019-02-16 MED ORDER — IBUPROFEN 600 MG PO TABS
600.0000 mg | ORAL_TABLET | Freq: Four times a day (QID) | ORAL | Status: DC
  Administered 2019-02-16 – 2019-02-17 (×3): 600 mg via ORAL
  Filled 2019-02-16 (×3): qty 1

## 2019-02-16 MED ORDER — BENZOCAINE-MENTHOL 20-0.5 % EX AERO
1.0000 "application " | INHALATION_SPRAY | CUTANEOUS | Status: DC | PRN
  Administered 2019-02-17: 1 via TOPICAL
  Filled 2019-02-16: qty 56

## 2019-02-16 MED ORDER — LACTATED RINGERS IV SOLN: INTRAVENOUS | Status: DC | PRN

## 2019-02-16 MED ORDER — OXYTOCIN BOLUS FROM INFUSION: 500.0000 mL | Freq: Once | INTRAVENOUS | Status: DC

## 2019-02-16 MED ORDER — ONDANSETRON HCL 4 MG/2ML IJ SOLN: 4.0000 mg | Freq: Once | INTRAMUSCULAR | Status: DC | PRN

## 2019-02-16 SURGICAL SUPPLY — 21 items
BLADE SURG 11 STRL SS (BLADE) ×2 IMPLANT
CLIP FILSHIE TUBAL LIGA STRL (Clip) ×2 IMPLANT
DRSG OPSITE POSTOP 3X4 (GAUZE/BANDAGES/DRESSINGS) ×2 IMPLANT
DURAPREP 26ML APPLICATOR (WOUND CARE) ×2 IMPLANT
GLOVE BIOGEL PI IND STRL 7.0 (GLOVE) ×1 IMPLANT
GLOVE BIOGEL PI IND STRL 7.5 (GLOVE) ×1 IMPLANT
GLOVE BIOGEL PI INDICATOR 7.0 (GLOVE) ×1
GLOVE BIOGEL PI INDICATOR 7.5 (GLOVE) ×1
GLOVE ECLIPSE 7.5 STRL STRAW (GLOVE) ×2 IMPLANT
GOWN STRL REUS W/TWL LRG LVL3 (GOWN DISPOSABLE) ×4 IMPLANT
HIBICLENS CHG 4% 4OZ BTL (MISCELLANEOUS) ×2 IMPLANT
NEEDLE HYPO 22GX1.5 SAFETY (NEEDLE) ×2 IMPLANT
NS IRRIG 1000ML POUR BTL (IV SOLUTION) ×2 IMPLANT
PACK ABDOMINAL MINOR (CUSTOM PROCEDURE TRAY) ×2 IMPLANT
PROTECTOR NERVE ULNAR (MISCELLANEOUS) ×2 IMPLANT
SPONGE LAP 4X18 RFD (DISPOSABLE) IMPLANT
SUT VICRYL 0 UR6 27IN ABS (SUTURE) ×2 IMPLANT
SUT VICRYL 4-0 PS2 18IN ABS (SUTURE) ×2 IMPLANT
SYR CONTROL 10ML LL (SYRINGE) ×2 IMPLANT
TOWEL OR 17X24 6PK STRL BLUE (TOWEL DISPOSABLE) ×4 IMPLANT
TRAY FOLEY W/BAG SLVR 14FR (SET/KITS/TRAYS/PACK) ×2 IMPLANT

## 2019-02-16 NOTE — Anesthesia Postprocedure Evaluation (Signed)
Anesthesia Post Note  Patient: Caitlin Terrell  Procedure(s) Performed: POST PARTUM TUBAL LIGATION (N/A Abdomen)     Patient location during evaluation: Mother Baby Anesthesia Type: Spinal Level of consciousness: oriented and awake and alert Pain management: pain level controlled Vital Signs Assessment: post-procedure vital signs reviewed and stable Respiratory status: spontaneous breathing and respiratory function stable Cardiovascular status: blood pressure returned to baseline and stable Postop Assessment: no headache, no backache, no apparent nausea or vomiting and able to ambulate Anesthetic complications: no    Last Vitals:  Vitals:   02/16/19 1545 02/16/19 1600  BP: 114/64 118/72  Pulse: (!) 105 (!) 108  Resp: (!) 21 19  Temp: 36.8 C   SpO2: 100% 100%    Last Pain:  Vitals:   02/16/19 1545  TempSrc: Oral   Pain Goal:    LLE Motor Response: No movement due to regional block (02/16/19 1545) LLE Sensation: Tingling (02/16/19 1545) RLE Motor Response: No movement due to regional block (02/16/19 1545) RLE Sensation: Tingling (02/16/19 1545)     Epidural/Spinal Function Cutaneous sensation: Tingles (02/16/19 1545), Patient able to flex knees: No (02/16/19 1545), Patient able to lift hips off bed: No (02/16/19 1545), Back pain beyond tenderness at insertion site: No (02/16/19 1545), Progressively worsening motor and/or sensory loss: No (02/16/19 1545), Bowel and/or bladder incontinence post epidural: No (02/16/19 1545)  Trevor Iha

## 2019-02-16 NOTE — H&P (Signed)
LABOR AND DELIVERY ADMISSION HISTORY AND PHYSICAL NOTE  Caitlin Terrell is a 34 y.o. female G2P1001 with IUP at [redacted]w[redacted]d by LMP c/w 19wk [place] presenting for SOL.   She reports positive fetal movement. She denies leakage of fluid, or vaginal bleeding.  Patient states she started having contractions this morning around midnight.   She plans on breast and bottle feeding. Her contraception plan is: BTL, papers signed 12/10/2019.  Prenatal History/Complications: PNC at [facility]:  @[redacted]w[redacted]d , CWD, normal anatomy, cephalic presentation, fundal placenta, 92%ile, EFW 3453 grams  Pregnancy complications:  - GDMA2, metformin - bipolar no meds - oral HSV - echogenic bowel on prenatal ultrasound, resolved on follow up  Past Medical History: Past Medical History:  Diagnosis Date  . Anxiety   . Asthma   . Environmental allergies   . Gestational diabetes   . Mental disorder    Bipolar    Past Surgical History: Past Surgical History:  Procedure Laterality Date  . 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 COLONOSCOPY    . KNEE SURGERY    . TONSILLECTOMY      Obstetrical History: OB History    Gravida  2   Para  1   Term  1   Preterm      AB      Living  1     SAB      TAB      Ectopic      Multiple      Live Births  1           Social History: Social History   Socioeconomic History  . Marital status: Divorced    Spouse name: Not on file  . Number of children: Not on file  . Years of education: Not on file  . Highest education level: Not on file  Occupational History    Employer: [employer]  Tobacco Use  . Smoking status: Never Smoker  . Smokeless tobacco: Never Used  Substance and Sexual Activity  . Alcohol use: Not Currently    Comment: none since pregnancy  . Drug use: Not Currently    Types: Marijuana    Comment: last use June 2020  . Sexual activity: Yes  Other Topics Concern  . Not on file  Social History Narrative  . Not on file    Social Determinants of Health   Financial Resource Strain:   . Difficulty of Paying Living Expenses: Not on file  Food Insecurity: Food Insecurity Present  . Worried About July 2020 in the Last Year: Sometimes true  . Ran Out of Food in the Last Year: Sometimes true  Transportation Needs: No Transportation Needs  . Lack of Transportation (Medical): No  . Lack of Transportation (Non-Medical): No  Physical Activity:   . Days of Exercise per Week: Not on file  . Minutes of Exercise per Session: Not on file  Stress:   . Feeling of Stress : Not on file  Social Connections:   . Frequency of Communication with Friends and Family: Not on file  . Frequency of Social Gatherings with Friends and Family: Not on file  . Attends Religious Services: Not on file  . Active Member of Clubs or Organizations: Not on file  . Attends Programme researcher, broadcasting/film/video Meetings: Not on file  . Marital Status: Not on file    Family History: Family History  Problem Relation Age of Onset  . Hypertension Mother   .  Diabetes Mother   . Mental illness Mother   . Heart disease Father     Allergies: Allergies  Allergen Reactions  . Amoxicillin Other (See Comments)    " I don't do well with cillins"  . Other Hives    "Orange herbal spice tea"  . Sulfa Antibiotics Hives    Medications Prior to Admission  Medication Sig Dispense Refill Last Dose  . albuterol (PROVENTIL) (2.5 MG/3ML) 0.083% nebulizer solution Take 3 mLs (2.5 mg total) by nebulization every 6 (six) hours as needed for wheezing or shortness of breath. 75 mL 12 Past Month at Unknown time  . albuterol (VENTOLIN HFA) 108 (90 Base) MCG/ACT inhaler Inhale 1-2 puffs into the lungs every 6 (six) hours as needed for wheezing or shortness of breath. 6.7 g 3 Past Month at Unknown time  . cetirizine (ZYRTEC ALLERGY) 10 MG tablet Take 1 tablet (10 mg total) by mouth daily. 30 tablet 11 02/15/2019 at Unknown time  . metFORMIN (GLUCOPHAGE) 500 MG tablet  Take 1 tablet (500 mg total) by mouth daily with supper. 30 tablet 2 02/15/2019 at Unknown time  . Prenatal Vit-Fe Fumarate-FA (PREPLUS) 27-1 MG TABS Take 1 tablet by mouth daily.   02/15/2019 at Unknown time  . Accu-Chek Softclix Lancets lancets Use as instructed 4 times daily 100 each 12   . acetaminophen (TYLENOL) 500 MG tablet Take 500 mg by mouth every 6 (six) hours as needed.     Marland Kitchen escitalopram (LEXAPRO) 5 MG tablet Take 1 tablet (5 mg total) by mouth daily. 30 tablet 0   . glucose blood (ACCU-CHEK GUIDE) test strip Use as instructed to check blood sugar 4 times daily 100 each 12      Review of Systems  All systems reviewed and negative except as stated in HPI  Physical Exam Weight 94.3 kg, last menstrual period 05/31/2018. General appearance: alert, oriented, Distress with contractions.  Lungs: normal respiratory effort Heart: regular rate Abdomen: soft, non-tender; gravid, leopolds Vertex, ~6-7 lbs Extremities: No calf swelling or tenderness Presentation: cephalic by RN SVE  Fetal monitoringBaseline: 150 bpm, Variability: Good {> 6 bpm), Accelerations: Non-reactive but appropriate for gestational age and Decelerations: Early Uterine activityFrequency: Every 2-3 minutes  Dilation: 6.5 Effacement (%): 90  Prenatal labs: ABO, Rh: O/Positive/-- (08/12 1545) Antibody: Negative (08/12 1545) Rubella: 4.38 (08/12 1545) RPR: Non Reactive (11/25 0851)  HBsAg: Negative (08/12 1545)  HIV: Non Reactive (11/25 0851)  GC/Chlamydia: no third trimester on file, neg/neg 09/25/2018 GBS:   not collected 2-hr GTT: abnormal (100, 188, 151) Genetic screening:  Low risk panorama Anatomy US: normal anatomy, echogenic bowel resolved on follow up  Prenatal Transfer Tool  Maternal Diabetes: Yes:  Diabetes Type:  Insulin/Medication controlled Genetic Screening: Normal Maternal Ultrasounds/Referrals: Normal Fetal Ultrasounds or other Referrals:  None Maternal Substance Abuse:  No Significant  Maternal Medications:  Meds include: Other: metformin, lexapro listed Significant Maternal Lab Results: None  No results found for this or any previous visit (from the past 24 hour(s)).  Patient Active Problem List   Diagnosis Date Noted  . Unwanted fertility 01/28/2019  . History of herpes genitalis 01/07/2019  . Gestational diabetes 12/15/2018  . Echogenic bowel of fetus on prenatal ultrasound 11/12/2018  . Cystic fibrosis carrier, antepartum 09/16/2018  . Gonorrhea in pregnancy, antepartum 09/02/2018  . Asthma 08/27/2018  . Supervision of high risk pregnancy, antepartum 08/05/2018  . Bipolar disease in pregnancy ([place]) 08/05/2018    Assessment: Caitlin Terrell is a 34 y.o. G2P1001 at  [redacted]w[redacted]d here for SOL.  #Labor: expectant management, AROM PRN #Pain: IV pain meds PRN, epidural upon request #FWB: Cat I #GBS/ID: not collected, will discuss if any GBS disease with last delivery to see if warrants empiric treatment #COVID: swab pending #MOF: Breast/Bottle #MOC: BTL, papers signed 12/10/2018 #Circ: N/a, female  #GDMA2: on metformin, CBG q4h latent labor q2h active labor  #Bipolar: listed in chart, also appears to be rx lexapro, will clarify as she should not be on SSRI due to risk of inducing mania though appears was prescribed by [facility] povider  Venora Maples 02/16/2019, 10:48 AM  I confirm that I have verified the information documented in the fellows note and that I have also personally performed the history, physical exam and all medical decision making activities of this service and have verified that all service and findings are accurately documented in this student's note.   Patient reports no history of GBS.   SROM with delivery of fetus ~6 minutes later. Last ate at 7am.  Will schedule for BTL as appropriate.   Tekeyah, Santiago, CNM 02/16/2019 11:35 AM

## 2019-02-16 NOTE — Anesthesia Procedure Notes (Signed)
Spinal  Patient location during procedure: OB Start time: 02/16/2019 2:57 PM End time: 02/16/2019 3:02 PM Staffing Performed: anesthesiologist  Anesthesiologist: Trevor Iha, MD Preanesthetic Checklist Completed: patient identified, IV checked, risks and benefits discussed, surgical consent, monitors and equipment checked, pre-op evaluation and timeout performed Spinal Block Patient position: sitting Prep: DuraPrep and site prepped and draped Patient monitoring: heart rate, cardiac monitor, continuous pulse ox and blood pressure Approach: midline Location: L3-4 Injection technique: single-shot Needle Needle type: Pencan  Needle gauge: 24 G Needle length: 10 cm Needle insertion depth: 6 cm Assessment Sensory level: T4 Additional Notes 1 Attempt (s). Pt tolerated procedure well.

## 2019-02-16 NOTE — MAU Note (Signed)
Pains started last night, thought it was gas.  Have gotten more intense this morning.  Started having some bleeding this morning. No recent exam, has not been tested for GBS.  79yrs since last delivery. Pt has GDM

## 2019-02-16 NOTE — Progress Notes (Signed)
Went to see patient PP to discuss BTL, she still desires permanent sterilization. Other reversible forms of contraception were discussed with patient; she declines all other modalities. Risks of procedure discussed with patient including but not limited to: risk of regret, permanence of method, bleeding, infection, injury to surrounding organs and need for additional procedures.  Failure risk of about 1% with increased risk of ectopic gestation if pregnancy occurs was also discussed with patient.  Also discussed possibility of post-tubal pain syndrome. The patient concurred with the proposed plan, giving informed written consent for the procedures.  Patient has been NPO since 0700 she will remain NPO for procedure. Anesthesia and OR aware.  Preoperative prophylactic antibiotics and SCDs ordered on call to the OR.  To OR when ready.

## 2019-02-16 NOTE — Progress Notes (Signed)
Patient declined Lexapro stating she does not feel anxious or nervous with her baby and doesn't to take the medicine right now.

## 2019-02-16 NOTE — Anesthesia Preprocedure Evaluation (Signed)
Anesthesia Evaluation  Patient identified by MRN, date of birth, ID band Patient awake    Reviewed: Allergy & Precautions, NPO status , Patient's Chart, lab work & pertinent test results  Airway Mallampati: II  TM Distance: >3 FB Neck ROM: Full    Dental no notable dental hx. (+) Teeth Intact   Pulmonary asthma ,    Pulmonary exam normal breath sounds clear to auscultation       Cardiovascular negative cardio ROS Normal cardiovascular exam Rhythm:Regular Rate:Normal     Neuro/Psych negative neurological ROS  negative psych ROS   GI/Hepatic negative GI ROS, Neg liver ROS,   Endo/Other  diabetes, Gestational  Renal/GU negative Renal ROS     Musculoskeletal negative musculoskeletal ROS (+)   Abdominal   Peds  Hematology Hgb 13.9 Plt 213   Anesthesia Other Findings   Reproductive/Obstetrics                             Anesthesia Physical Anesthesia Plan  ASA: III  Anesthesia Plan: Spinal   Post-op Pain Management:    Induction:   PONV Risk Score and Plan: Treatment may vary due to age or medical condition, Ondansetron and Dexamethasone  Airway Management Planned: Nasal Cannula and Natural Airway  Additional Equipment: None  Intra-op Plan:   Post-operative Plan:   Informed Consent: I have reviewed the patients History and Physical, chart, labs and discussed the procedure including the risks, benefits and alternatives for the proposed anesthesia with the patient or authorized representative who has indicated his/her understanding and acceptance.     Dental advisory given  Plan Discussed with:   Anesthesia Plan Comments:         Anesthesia Quick Evaluation

## 2019-02-16 NOTE — MAU Note (Signed)
Pt is a G2P1 at 37.2 weeks with ctx since last night.  GDMA on metformin, h/o abdominal surgery at birth.  No other OB complications.  SVE 6.5 with BBOW, admit to L&D.

## 2019-02-16 NOTE — Lactation Note (Signed)
This note was copied from a baby's chart. Lactation Consultation Note  Patient Name: Caitlin Terrell LIDCV'U Date: 02/16/2019   1525 LC attempted to visit with mom, but mom in OR for tubal ligation. Mom's sister at bedside holding infant skin to skin, just gave baby some formula via bottle. Per sister, mom desires to breastfeed and will welcome LC support. Lactation brochure left at bedside.  Virgia Land 02/16/2019, 3:31 PM

## 2019-02-16 NOTE — Discharge Summary (Signed)
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 yo G2P1001 at 36w2das admitted in Active Labor on 02/16/2019. Patient presented in active labor reporting contractions since Midnight.  Patient arrived on unit at 6-7 cm with bulging bag.  Patient denies history of GBS and AROM performed with moderate amt clear fluid.  Cat I FT remained.  Patient progressed quickly to complete and pushed spontaneously.  Patient delivered viable female with staff and family support.  Membrane Rupture Time/Date: 11:04 AM ,02/16/2019   Intrapartum Procedures: Episiotomy: None [1]                                         Lacerations:  Labial [10];Periurethral [8]  Patient had a delivery of a Viable infant. 02/16/2019  Information for the patient's newborn:  WiMariabelen, Presslyirl JeValyncia0[[id number]]Delivery Method: Vaginal, Spontaneous(Filed from Delivery Summary)     Pateint had an uncomplicated postpartum  course.  She is ambulating, tolerating a regular diet, passing flatus, and urinating well. Patient is discharged home in stable condition on 02/17/19.  Delivery time: 11:10 AM    Magnesium Sulfate received: No BMZ received: No Rhophylac:N/A MMR:N/A Transfusion:No  Physical exam  Vitals:   02/16/19 1815 02/16/19 2200 02/17/19 0202 02/17/19 0600  BP: 116/66 105/76 (!) 101/57 111/75  Pulse: (!) 112 (!) 101 99 76  Resp: '18 16 16 16  ' Temp: 99.3 F (37.4 C) 98.1 F (36.7 C) 98.3 F (36.8 C) 97.7 F (36.5 C)  TempSrc: Oral Oral Oral Oral  SpO2: 99% 98% 99% 100%  Weight:      Height:       General: alert, cooperative and no distress Lochia: appropriate Uterine Fundus: firm Incision: Healing well with no significant drainage DVT Evaluation: No evidence of DVT seen on physical exam. Labs: Lab Results  Component Value Date   WBC 8.6 02/16/2019   HGB 13.9 02/16/2019   HCT 41.1 02/16/2019  MCV 91.5 02/16/2019   PLT 213 02/16/2019   CMP Latest Ref Rng & Units 02/17/2019  Glucose 70 - 99 mg/dL 103(H)  BUN 6 - 20 mg/dL -  Creatinine 0.57 - 1.00 mg/dL -  Sodium 134 - 144 mmol/L -  Potassium 3.5 - 5.2 mmol/L -  Chloride 96 - 106 mmol/L -  CO2 20 - 29 mmol/L -  Calcium 8.7 - 10.2 mg/dL -  Total Protein 6.0 - 8.5 g/dL -  Total Bilirubin 0.0 - 1.2 mg/dL -  Alkaline Phos 39 - 117 IU/L -  AST 0 - 40 IU/L -  ALT 0 - 32 IU/L -    Discharge instruction: per After Visit Summary and "Baby and Me Booklet".  After visit meds:  Allergies as of 02/17/2019      Reactions   Amoxicillin Other (See Comments)   " I don't do well with cillins"   Other Hives   "Orange herbal spice tea"   Sulfa Antibiotics Hives      Medication List    STOP taking these medications   Accu-Chek Guide test strip Generic drug: glucose blood   Accu-Chek Softclix Lancets lancets   metFORMIN 500 MG tablet Commonly known as: GLUCOPHAGE     TAKE these medications   acetaminophen 325 MG tablet Commonly  known as: Tylenol Take 2 tablets (650 mg total) by mouth every 4 (four) hours as needed for mild pain or headache (for pain scale < 4). What changed:   medication strength  how much to take  when to take this  reasons to take this   albuterol 108 (90 Base) MCG/ACT inhaler Commonly known as: VENTOLIN HFA Inhale 1-2 puffs into the lungs every 6 (six) hours as needed for wheezing or shortness of breath.   albuterol (2.5 MG/3ML) 0.083% nebulizer solution Commonly known as: PROVENTIL Take 3 mLs (2.5 mg total) by nebulization every 6 (six) hours as needed for wheezing or shortness of breath.   cetirizine 10 MG tablet Commonly known as: ZyrTEC Allergy Take 1 tablet (10 mg total) by mouth daily.   escitalopram 5 MG tablet Commonly known as: Lexapro Take 1 tablet (5 mg total) by mouth daily.   ibuprofen 800 MG tablet Commonly known as: ADVIL Take 1 tablet (800 mg total) by mouth every 8 (eight) hours as needed for headache or cramping.   PrePLUS 27-1 MG Tabs Take 1 tablet by mouth daily.       Diet: routine diet  Activity: Advance as tolerated. Pelvic rest for 6 weeks.   Outpatient follow up:4 weeks Follow up Appt: Future Appointments  Date Time Provider [place]  03/25/2019  8:20 AM [facility] [place]  03/25/2019  8:55 AM Jorje Guild, NP [place] [facility]   Follow up Visit: Message Sent 02/16/2019  Please schedule this patient for Postpartum visit in: 6 weeks with the following provider: Any provider In-Person For C/S patients schedule nurse incision check in weeks 2 weeks: no High risk pregnancy complicated by: GDM Delivery mode:  SVD Anticipated Birth Control:  BTL done PP PP Procedures needed: 2 hour GTT  Schedule Integrated [facility] visit: no     Newborn Data: Live born female-Jamala  Birth Weight:   APGAR: 9, 9  Newborn Delivery   Birth date/time: 02/16/2019 11:10:00 Delivery type: Vaginal, Spontaneous      Baby Feeding: Bottle and  Breast Disposition:home with mother   Marcille Buffy DNP, CNM  02/17/19  10:05 AM

## 2019-02-16 NOTE — Op Note (Signed)
Maleena Eddleman 02/16/2019  PREOPERATIVE DIAGNOSIS:  Undesired fertility  POSTOPERATIVE DIAGNOSIS:  Undesired fertility  PROCEDURE:  Postpartum Bilateral Tubal Sterilization using Filshie Clips   SURGEON:  Dr Scheryl Darter  ASSISTANT: Dr. Zack Seal  ANESTHESIA:  Epidural  COMPLICATIONS:  None immediate.  ESTIMATED BLOOD LOSS:  Less than 20cc.  FLUIDS: 1000 mL LR.  URINE OUTPUT:  150 mL of clear urine.  INDICATIONS: 34 y.o. yo G2P2002  with undesired fertility,status post vaginal delivery, desires permanent sterilization. Risks and benefits of procedure discussed with patient including permanence of method, bleeding, infection, injury to surrounding organs and need for additional procedures. Risk failure of 0.5-1% with increased risk of ectopic gestation if pregnancy occurs was also discussed with patient.   FINDINGS:  Normal uterus, tubes, and ovaries.  TECHNIQUE:  The patient was taken to the operating room where her epidural anesthesia was dosed up to surgical level and found to be adequate.  She was then placed in the dorsal supine position and prepped and draped in sterile fashion.  After an adequate timeout was performed, attention was turned to the patient's abdomen where a small transverse skin incision was made under the umbilical fold. The incision was taken down to the layer of fascia using the scalpel, and fascia was incised, and extended bilaterally using Mayo scissors. The peritoneum was entered in a sharp fashion. Attention was then turned to the patient's uterus, and left fallopian tube was identified and followed out to the fimbriated end.    A Filshie clip was placed on the left fallopian tube about 2 cm from the cornual attachment, with care given to incorporate the underlying mesosalpinx.  A similar process was carried out on the rightl side allowing for bilateral tubal sterilization.    Good hemostasis was noted overall.  Local analgesia was drizzled on both  operative sites.The instruments were then removed from the patient's abdomen and the fascial incision was repaired with 0 Vicryl, and the skin was closed with a 3-0 Vicryl subcuticular stitch. The patient tolerated the procedure well.  Sponge, lap, and needle counts were correct times two.  The patient was then taken to the recovery room awake, extubated and in stable condition.   Venora Maples, MD 02/16/2019 3:33 PM

## 2019-02-16 NOTE — Transfer of Care (Signed)
Immediate Anesthesia Transfer of Care Note  Patient: Caitlin Terrell  Procedure(s) Performed: POST PARTUM TUBAL LIGATION (N/A Abdomen)  Patient Location: PACU  Anesthesia Type:Spinal  Level of Consciousness: awake  Airway & Oxygen Therapy: Patient Spontanous Breathing  Post-op Assessment: Report given to RN and Post -op Vital signs reviewed and stable  Post vital signs: Reviewed and stable  Last Vitals:  Vitals Value Taken Time  BP 124/68 02/16/19 1539  Temp    Pulse 113 02/16/19 1541  Resp 22 02/16/19 1541  SpO2 100 % 02/16/19 1541  Vitals shown include unvalidated device data.  Last Pain:  Vitals:   02/16/19 1431  TempSrc: Oral         Complications: No apparent anesthesia complications

## 2019-02-17 ENCOUNTER — Ambulatory Visit (HOSPITAL_COMMUNITY): Payer: Medicaid Other

## 2019-02-17 ENCOUNTER — Encounter (HOSPITAL_COMMUNITY): Payer: Self-pay

## 2019-02-17 LAB — GLUCOSE, RANDOM: Glucose, Bld: 103 mg/dL — ABNORMAL HIGH (ref 70–99)

## 2019-02-17 LAB — RPR: RPR Ser Ql: NONREACTIVE

## 2019-02-17 MED ORDER — ACETAMINOPHEN 325 MG PO TABS: 650.0000 mg | ORAL_TABLET | ORAL | 0 refills | Status: DC | PRN

## 2019-02-17 MED ORDER — IBUPROFEN 800 MG PO TABS: 800.0000 mg | ORAL_TABLET | Freq: Three times a day (TID) | ORAL | 0 refills | Status: DC | PRN

## 2019-02-17 NOTE — Clinical Social Work Maternal (Signed)
CLINICAL SOCIAL WORK MATERNAL/CHILD NOTE  Patient Details  Name: Caitlin Terrell MRN: [id number] Date of Birth: 10/14/1985  Date:  02/17/2019  Clinical Social Worker Initiating Note:  Eulamae Greenstein, LCSW Date/Time: Initiated:  02/17/19/1025     Child's Name:  Caitlin Terrell   Biological Parents:  Mother   Need for Interpreter:  None   Reason for Referral:  Behavioral Health Concerns   Address:  [address] [place] [address]    Phone number:  [phone] (home)     Additional phone number: none   Household Members/Support Persons (HM/SP):   Household Member/Support Person 1   HM/SP Name Relationship DOB or Age  HM/SP -1 Estephania Nippert MOB   31  HM/SP -2   Tony Irvin  son   10  HM/SP -3        HM/SP -4        HM/SP -5        HM/SP -6        HM/SP -7        HM/SP -8          Natural Supports (not living in the home):  Immediate Family(MOB's sister.)   Professional Supports: Therapist   Employment: Full-time   Type of Work: works from home for [employer].   Education:  Vocation/technical training   Homebound arranged:  n/a  Financial Resources:  Medicaid   Other Resources:  Food Stamps , WIC(plans to reapply for Food Stamps.)   Cultural/Religious Considerations Which May Impact Care:  none reported.   Strengths:  Ability to meet basic needs , Compliance with medical plan , Home prepared for child , Psychotropic Medications, Pediatrician chosen   Psychotropic Medications:  Lexapro      Pediatrician:    [place] area  Pediatrician List:   [place]    [place] Other([facility] on [address])  [place]    [place]    [place]    [place]      Pediatrician Fax Number:    Risk Factors/Current Problems:      Cognitive State:  Insightful , Able to Concentrate , Alert    Mood/Affect:  Interested , Relaxed , Comfortable , Calm    CSW Assessment: CSW consulted as MOB has a history of anxiety and  Bipolar Disorder. Upon further chart review, CSW noted that MOB reported THC use in June 2020. CSW went to speak with MOB at bedside to address further needs.   CSW congratulated MOB on the birth of infant. CSW advised MOB of the HIPPA policy as CSW noted that someone else was in the room with MOB. MOB reported that this was her sister and that it was okay for her to remain in the room while CSW spoke with her. CSW understanding and proceeded with assessing MOB. CSW advised MOB of CSW's role and the reason for CSW coming to speak with her. MOB reports that was diagnosed with anxiety and Bipolar as a child. MOB report that she was on medications in the past but stopped and has now been prescribed Lexapro for after her pregnancy. MOB reports that she plans to take her medication due to hopes of eleminating PPD and its symptoms. MOB reported that she had PPD with her oldest son but wasn't really aware of it. MOB expressed that she is was just recently set up with a therapist and has plans to follow up with them once discharged. MOB reported that she is not   feeling SI or HI at this time ad expressed that she has support from her sister, as FOB will not be involved at this time.   CSW inquired from [facility] on her [facility] use as MOB reported in other records that her last use for [facility] was in June 2020. MOB admitted that she knew she was pregnant and that she stopped all substance use at that time. MOB disclosed that she didn't use any other substances while pregnant and that she feels so much better "now that I am clean:. MOB assured CSW that her only substance use ever was THC. MOB expressed that she has been in rehab for her self and for other needs in life. CSW congratulated MOB on that and advised MOB that CSW was proud of her for this. MOB reproted that she tends to cope with what she is dealing with through work. MOB reports that she can sometimes be a "workaholic" which helps her a lot. MOB reports that she has been  feeling good and reports that she has all needed items to care for infant with no other needs.   CSW provided MOB with PPD and SIDS education. CSW will continue to monitor infants CDS for CPS report if warranted.  CSW Plan/Description:  No Further Intervention Required/No Barriers to Discharge, Sudden Infant Death Syndrome (SIDS) Education, Perinatal Mood and Anxiety Disorder (PMADs) Education, Hospital Drug Screen Policy Information, CSW Will Continue to Monitor Umbilical Cord Tissue Drug Screen Results and Make Report if Lorrine Kin 2019-11-14, 10:44 AM

## 2019-02-17 NOTE — Lactation Note (Signed)
This note was copied from a baby's chart. Lactation Consultation Note  Patient Name: Caitlin Terrell OZHYQ'M Date: 02/17/2019 Reason for consult: Initial assessment;1st time breastfeeding;Early term 37-38.6wks;Maternal endocrine disorder Type of Endocrine Disorder?: Diabetes  LC in to visit with P2 Mom of ET infant at 24 hrs.  Baby at 3% weight loss.  Baby has been given all formula by bottles since right after birth when baby latched and fed for 30 mins with a latch score of 8.    Mom's sister in room and wanting a DEBP set up for Mom.  Mom maybe to be discharged today.  Mom not talking much, she is just looking at her phone.    Baby asleep swaddled in crib.  Mom states that baby had a bottle 2 hrs ago.  Talked about importance of baby learning to breastfeed.  Encouraged keeping baby STS, and offering breast with cues.  Offered to assist with positioning and latching. Mom agreeable after a long pause.  Set Mom upright with pillow to her back.  Pillows placed to support baby at breast height.  Reviewed breast massage and hand expression.  Small drop of colostrum noted.    Baby fussy and needing some encouragement.  Baby able to open her mouth wide and latch, but screaming and coming off the breast.  Fed baby 5 ml of formula by bottle while side lying, and baby calmed down.  Baby sucking on and off, but fell asleep.  Encouraged Mom to place her STS on her chest.   Mom encouraged to call for her nurse when baby starts cueing again.  Reviewed cues and what to look for.    Hand pump given and demonstrated how to use and clean.   Talked about getting OP lactation follow-up after discharge.  Mom shown phone numbers to call to schedule appointment.  Mom also shown number to call and ask questions.   No questions currently.   Maternal Data Formula Feeding for Exclusion: Yes Reason for exclusion: Mother's choice to formula and breast feed on admission Has patient been taught Hand Expression?:  Yes Does the patient have breastfeeding experience prior to this delivery?: No  Feeding Feeding Type: Breast Fed Nipple Type: Slow - flow  LATCH Score Latch: Repeated attempts needed to sustain latch, nipple held in mouth throughout feeding, stimulation needed to elicit sucking reflex.  Audible Swallowing: None  Type of Nipple: Everted at rest and after stimulation  Comfort (Breast/Nipple): Soft / non-tender  Hold (Positioning): Full assist, staff holds infant at breast  LATCH Score: 5  Interventions Interventions: Breast feeding basics reviewed;Assisted with latch;Skin to skin;Breast massage;Hand express;Pre-pump if needed;Breast compression;Adjust position;Support pillows;Position options;Hand pump  Lactation Tools Discussed/Used WIC Program: Yes Pump Review: Setup, frequency, and cleaning;Milk Storage Initiated by:: Erby Pian RN IBCLC Date initiated:: 02/17/19   Consult Status Consult Status: Complete Date: 02/17/19 Follow-up type: Call as needed    Judee Clara 02/17/2019, 11:38 AM

## 2019-02-17 NOTE — Progress Notes (Signed)
CSW received and acknowledges consult for EDPS of 9.  Consult screened out due to 9 on EDPS does not warrant a CSW consult.  MOB whom scores are greater than 9/yes to question 10 on Edinburgh Postpartum Depression Screen warrants a CSW consult.    Deepika Decatur S. Carlisle Enke, MSW, LCSW [facility] [phone]   

## 2019-02-17 NOTE — Anesthesia Postprocedure Evaluation (Signed)
Anesthesia Post Note  Patient: Caitlin Terrell  Procedure(s) Performed: POST PARTUM TUBAL LIGATION (N/A Abdomen)     Patient location during evaluation: Mother Baby Anesthesia Type: Spinal Level of consciousness: awake and alert Pain management: pain level controlled Vital Signs Assessment: post-procedure vital signs reviewed and stable Respiratory status: spontaneous breathing Cardiovascular status: stable Postop Assessment: no headache, spinal receding, adequate PO intake, no backache, patient able to bend at knees, able to ambulate and no apparent nausea or vomiting Anesthetic complications: no    Last Vitals:  Vitals:   02/17/19 0202 02/17/19 0600  BP: (!) 101/57 111/75  Pulse: 99 76  Resp: 16 16  Temp: 36.8 C 36.5 C  SpO2: 99% 100%    Last Pain:  Vitals:   02/17/19 0600  TempSrc: Oral  PainSc: 2    Pain Goal:                   Salome Arnt

## 2019-02-17 NOTE — Addendum Note (Signed)
Addendum  created 02/17/19 0804 by Armanda Heritage, CRNA   Clinical Note Signed

## 2019-02-18 ENCOUNTER — Encounter: Payer: Medicaid Other | Admitting: Obstetrics & Gynecology

## 2019-02-22 ENCOUNTER — Encounter (HOSPITAL_COMMUNITY): Payer: Self-pay | Admitting: Obstetrics and Gynecology

## 2019-02-22 ENCOUNTER — Other Ambulatory Visit: Payer: Self-pay

## 2019-02-22 ENCOUNTER — Inpatient Hospital Stay (HOSPITAL_COMMUNITY)
Admission: AD | Admit: 2019-02-22 | Discharge: 2019-02-22 | Disposition: A | Discharge status: Home / Self Care | Payer: Medicaid Other | Attending: Obstetrics and Gynecology | Admitting: Obstetrics and Gynecology

## 2019-02-22 DIAGNOSIS — O99893 Other specified diseases and conditions complicating puerperium: Secondary | ICD-10-CM | POA: Insufficient documentation

## 2019-02-22 DIAGNOSIS — O165 Unspecified maternal hypertension, complicating the puerperium: Secondary | ICD-10-CM | POA: Diagnosis not present

## 2019-02-22 DIAGNOSIS — R030 Elevated blood-pressure reading, without diagnosis of hypertension: Secondary | ICD-10-CM | POA: Diagnosis not present

## 2019-02-22 DIAGNOSIS — R7401 Elevation of levels of liver transaminase levels: Secondary | ICD-10-CM

## 2019-02-22 DIAGNOSIS — O99891 Other specified diseases and conditions complicating pregnancy: Secondary | ICD-10-CM

## 2019-02-22 LAB — URINALYSIS, ROUTINE W REFLEX MICROSCOPIC
Bacteria, UA: NONE SEEN
Bilirubin Urine: NEGATIVE
Glucose, UA: NEGATIVE mg/dL
Ketones, ur: NEGATIVE mg/dL
Nitrite: NEGATIVE
Protein, ur: NEGATIVE mg/dL
Specific Gravity, Urine: 1.004 — ABNORMAL LOW (ref 1.005–1.030)
pH: 7 (ref 5.0–8.0)

## 2019-02-22 LAB — COMPREHENSIVE METABOLIC PANEL
ALT: 46 U/L — ABNORMAL HIGH (ref 0–44)
AST: 28 U/L (ref 15–41)
Albumin: 3.2 g/dL — ABNORMAL LOW (ref 3.5–5.0)
Alkaline Phosphatase: 95 U/L (ref 38–126)
Anion gap: 9 (ref 5–15)
BUN: 9 mg/dL (ref 6–20)
CO2: 23 mmol/L (ref 22–32)
Calcium: 8.8 mg/dL — ABNORMAL LOW (ref 8.9–10.3)
Chloride: 109 mmol/L (ref 98–111)
Creatinine, Ser: 0.89 mg/dL (ref 0.44–1.00)
GFR calc Af Amer: >60 mL/min (ref >60)
GFR calc non Af Amer: >60 mL/min (ref >60)
Glucose, Bld: 110 mg/dL — ABNORMAL HIGH (ref 70–99)
Potassium: 3.8 mmol/L (ref 3.5–5.1)
Sodium: 141 mmol/L (ref 135–145)
Total Bilirubin: 0.6 mg/dL (ref 0.3–1.2)
Total Protein: 6.2 g/dL — ABNORMAL LOW (ref 6.5–8.1)

## 2019-02-22 LAB — CBC
HCT: 37 % (ref 36.0–46.0)
Hemoglobin: 12.7 g/dL (ref 12.0–15.0)
MCH: 31.5 pg (ref 26.0–34.0)
MCHC: 34.3 g/dL (ref 30.0–36.0)
MCV: 91.8 fL (ref 80.0–100.0)
Platelets: 279 10*3/uL (ref 150–400)
RBC: 4.03 MIL/uL (ref 3.87–5.11)
RDW: 14.4 % (ref 11.5–15.5)
WBC: 4.8 10*3/uL (ref 4.0–10.5)
nRBC: 0 % (ref 0.0–0.2)

## 2019-02-22 LAB — PROTEIN / CREATININE RATIO, URINE
Creatinine, Urine: 40.5 mg/dL
Total Protein, Urine: <6 mg/dL

## 2019-02-22 MED ORDER — ENALAPRIL MALEATE 5 MG PO TABS: 5.0000 mg | ORAL_TABLET | Freq: Every day | ORAL | 0 refills | Status: DC

## 2019-02-22 NOTE — Discharge Instructions (Signed)
Preeclampsia and Eclampsia Preeclampsia is a serious condition that may develop during pregnancy. This condition causes high blood pressure and increased protein in your urine along with other symptoms, such as headaches and vision changes. These symptoms may develop as the condition gets worse. Preeclampsia may occur at 20 weeks of pregnancy or later. Diagnosing and treating preeclampsia early is very important. If not treated early, it can cause serious problems for you and your baby. One problem it can lead to is eclampsia. Eclampsia is a condition that causes muscle jerking or shaking (convulsions or seizures) and other serious problems for the mother. During pregnancy, delivering your baby may be the best treatment for preeclampsia or eclampsia. For most women, preeclampsia and eclampsia symptoms go away after giving birth. In rare cases, a woman may develop preeclampsia after giving birth (postpartum preeclampsia). This usually occurs within 48 hours after childbirth but may occur up to 6 weeks after giving birth. What are the causes? The cause of preeclampsia is not known. What increases the risk? The following risk factors make you more likely to develop preeclampsia:  Being pregnant for the first time.  Having had preeclampsia during a past pregnancy.  Having a family history of preeclampsia.  Having high blood pressure.  Being pregnant with more than one baby.  Being 35 or older.  Being African-American.  Having kidney disease or diabetes.  Having medical conditions such as lupus or blood diseases.  Being very overweight (obese). What are the signs or symptoms? The most common symptoms are:  Severe headaches.  Vision problems, such as blurred or double vision.  Abdominal pain, especially upper abdominal pain. Other symptoms that may develop as the condition gets worse include:  Sudden weight gain.  Sudden swelling of the hands, face, legs, and feet.  Severe nausea  and vomiting.  Numbness in the face, arms, legs, and feet.  Dizziness.  Urinating less than usual.  Slurred speech.  Convulsions or seizures. How is this diagnosed? There are no screening tests for preeclampsia. Your health care provider will ask you about symptoms and check for signs of preeclampsia during your prenatal visits. You may also have tests that include:  Checking your blood pressure.  Urine tests to check for protein. Your health care provider will check for this at every prenatal visit.  Blood tests.  Monitoring your baby's heart rate.  Ultrasound. How is this treated? You and your health care provider will determine the treatment approach that is best for you. Treatment may include:  Having more frequent prenatal exams to check for signs of preeclampsia, if you have an increased risk for preeclampsia.  Medicine to lower your blood pressure.  Staying in the hospital, if your condition is severe. There, treatment will focus on controlling your blood pressure and the amount of fluids in your body (fluid retention).  Taking medicine (magnesium sulfate) to prevent seizures. This may be given as an injection or through an IV.  Taking a low-dose aspirin during your pregnancy.  Delivering your baby early. You may have your labor started with medicine (induced), or you may have a cesarean delivery. Follow these instructions at home: Eating and drinking   Drink enough fluid to keep your urine pale yellow.  Avoid caffeine. Lifestyle  Do not use any products that contain nicotine or tobacco, such as cigarettes and e-cigarettes. If you need help quitting, ask your health care provider.  Do not use alcohol or drugs.  Avoid stress as much as possible. Rest and get   plenty of sleep. General instructions  Take over-the-counter and prescription medicines only as told by your health care provider.  When lying down, lie on your left side. This keeps pressure off your  major blood vessels.  When sitting or lying down, raise (elevate) your feet. Try putting some pillows underneath your lower legs.  Exercise regularly. Ask your health care provider what kinds of exercise are best for you.  Keep all follow-up and prenatal visits as told by your health care provider. This is important. How is this prevented? There is no known way of preventing preeclampsia or eclampsia from developing. However, to lower your risk of complications and detect problems early:  Get regular prenatal care. Your health care provider may be able to diagnose and treat the condition early.  Maintain a healthy weight. Ask your health care provider for help managing weight gain during pregnancy.  Work with your health care provider to manage any long-term (chronic) health conditions you have, such as diabetes or kidney problems.  You may have tests of your blood pressure and kidney function after giving birth.  Your health care provider may have you take low-dose aspirin during your next pregnancy. Contact a health care provider if:  You have symptoms that your health care provider told you may require more treatment or monitoring, such as: ? Headaches. ? Nausea or vomiting. ? Abdominal pain. ? Dizziness. ? Light-headedness. Get help right away if:  You have severe: ? Abdominal pain. ? Headaches that do not get better. ? Dizziness. ? Vision problems. ? Confusion. ? Nausea or vomiting.  You have any of the following: ? A seizure. ? Sudden, rapid weight gain. ? Sudden swelling in your hands, ankles, or face. ? Trouble moving any part of your body. ? Numbness in any part of your body. ? Trouble speaking. ? Abnormal bleeding.  You faint. Summary  Preeclampsia is a serious condition that may develop during pregnancy.  This condition causes high blood pressure and increased protein in your urine along with other symptoms, such as headaches and vision  changes.  Diagnosing and treating preeclampsia early is very important. If not treated early, it can cause serious problems for you and your baby.  Get help right away if you have symptoms that your health care provider told you to watch for. This information is not intended to replace advice given to you by your health care provider. Make sure you discuss any questions you have with your health care provider. Document Revised: 09/03/2017 Document Reviewed: 08/08/2015 Elsevier Patient Education  2020 Elsevier Inc.  

## 2019-02-22 NOTE — MAU Provider Note (Signed)
Chief Complaint  Patient presents with  . Blood Pressure Check  . Leg Swelling     First Provider Initiated Contact with Patient 02/22/19 1403      S: Caitlin Terrell  is a 34 y.o. y.o. year old [id number] female at PPD#6 SVD who presents to MAU with elevated blood pressures 130/90's on home cuff and swelling in bilat ankles. No Hx HTN. Current blood pressure medication: None  Associated symptoms: Denies Headache, vision changes, epigastric pain, calf pain.   O:  Patient Vitals for the past 24 hrs:  BP Temp Pulse Resp SpO2  02/22/19 1515 116/86 -- 67 -- --  02/22/19 1500 134/90 -- 75 -- --  02/22/19 1445 120/85 -- 71 -- --  02/22/19 1430 118/86 -- 70 -- --  02/22/19 1415 120/88 -- 71 -- --  02/22/19 1400 116/84 -- 79 -- --  02/22/19 1345 116/79 -- 70 -- --  02/22/19 1330 114/80 -- 77 -- --  02/22/19 1326 117/90 97.7 F (36.5 C) 83 16 100 %   General: NAD Heart: Regular rate Lungs: Normal rate and effort Abd: Soft, NT, Gravid, S=D Extremities: 2+Pedal edema Neuro: 2+ deep tendon reflexes, No clonus  Results for orders placed or performed during the hospital encounter of 02/22/19 (from the past 24 hour(s))  Urinalysis, Routine w reflex microscopic     Status: Abnormal   Collection Time: 02/22/19  1:27 PM  Result Value Ref Range   Color, Urine STRAW (A) YELLOW   APPearance CLEAR CLEAR   Specific Gravity, Urine 1.004 (L) 1.005 - 1.030   pH 7.0 5.0 - 8.0   Glucose, UA NEGATIVE NEGATIVE mg/dL   Hgb urine dipstick MODERATE (A) NEGATIVE   Bilirubin Urine NEGATIVE NEGATIVE   Ketones, ur NEGATIVE NEGATIVE mg/dL   Protein, ur NEGATIVE NEGATIVE mg/dL   Nitrite NEGATIVE NEGATIVE   Leukocytes,Ua TRACE (A) NEGATIVE   RBC / HPF 0-5 0 - 5 RBC/hpf   WBC, UA 0-5 0 - 5 WBC/hpf   Bacteria, UA NONE SEEN NONE SEEN   Squamous Epithelial / LPF 0-5 0 - 5  Protein / creatinine ratio, urine     Status: None   Collection Time: 02/22/19  1:27 PM  Result Value Ref Range   Creatinine, Urine 40.50  mg/dL   Total Protein, Urine <6 mg/dL   Protein Creatinine Ratio        0.00 - 0.15 mg/mg[Cre]  CBC     Status: None   Collection Time: 02/22/19  2:06 PM  Result Value Ref Range   WBC 4.8 4.0 - 10.5 K/uL   RBC 4.03 3.87 - 5.11 MIL/uL   Hemoglobin 12.7 12.0 - 15.0 g/dL   HCT 24.2 35.3 - 61.4 %   MCV 91.8 80.0 - 100.0 fL   MCH 31.5 26.0 - 34.0 pg   MCHC 34.3 30.0 - 36.0 g/dL   RDW 43.1 54.0 - 08.6 %   Platelets 279 150 - 400 K/uL   nRBC 0.0 0.0 - 0.2 %  Comprehensive metabolic panel     Status: Abnormal   Collection Time: 02/22/19  2:06 PM  Result Value Ref Range   Sodium 141 135 - 145 mmol/L   Potassium 3.8 3.5 - 5.1 mmol/L   Chloride 109 98 - 111 mmol/L   CO2 23 22 - 32 mmol/L   Glucose, Bld 110 (H) 70 - 99 mg/dL   BUN 9 6 - 20 mg/dL   Creatinine, Ser 7.61 0.44 - 1.00 mg/dL   Calcium  8.8 (L) 8.9 - 10.3 mg/dL   Total Protein 6.2 (L) 6.5 - 8.1 g/dL   Albumin 3.2 (L) 3.5 - 5.0 g/dL   AST 28 15 - 41 U/L   ALT 46 (H) 0 - 44 U/L   Alkaline Phosphatase 95 38 - 126 U/L   Total Bilirubin 0.6 0.3 - 1.2 mg/dL   GFR calc non Af Amer >60 >60 mL/min   GFR calc Af Amer >60 >60 mL/min   Anion gap 9 5 - 15   MDM Mildly elevated BPs w/ very mild elevation of ALT. Consulted w/ Dr. Rip Harbour. W/ such minor elevated or BP and ALT admission and Mag Sulfate not indicated at this time, but pt needs close F/U for worsening BP and labs indicative or Pre-E. In-basket message sent to office requesting BP check and pre-E labs in 24-48 hours. Pre-E precautions reviewed in detail. Return to MAU ASAP for worsening Sx.   A:  1. Postpartum hypertension   2. Maternal AST (aspartate aminotransferase) elevation     P: Discharge home in stable condition per consult with Chancy Milroy, MD. Preeclampsia precautions. Follow-up for blood pressure check in 1-2 days at [facility].  Return to maternity admissions as needed if symptoms worsen.  Tamala Julian, [place], [place] 02/22/2019 3:47 PM

## 2019-02-22 NOTE — MAU Note (Signed)
Caitlin Terrell is a 34 y.o.here in MAU reporting:  +elevated blood pressure at home 130's/90's +bilateral ankle swelling PP SVD on 02/16/19 Pain score: denies Patient denies headache, epigastric pain, changes in vision. Vitals:   02/22/19 1326 02/22/19 1330  BP: 117/90 114/80  Pulse: 83 77  Resp: 16   Temp: 97.7 F (36.5 C)   SpO2: 100%      Lab orders placed from triage: ua

## 2019-02-24 ENCOUNTER — Ambulatory Visit (HOSPITAL_COMMUNITY): Payer: Medicaid Other

## 2019-02-24 ENCOUNTER — Ambulatory Visit: Payer: Medicaid Other

## 2019-02-24 ENCOUNTER — Encounter: Payer: Medicaid Other | Admitting: Obstetrics and Gynecology

## 2019-02-25 ENCOUNTER — Other Ambulatory Visit: Payer: Self-pay

## 2019-02-25 ENCOUNTER — Encounter: Payer: Medicaid Other | Admitting: Obstetrics & Gynecology

## 2019-02-25 ENCOUNTER — Ambulatory Visit (INDEPENDENT_AMBULATORY_CARE_PROVIDER_SITE_OTHER): Payer: Medicaid Other | Admitting: General Practice

## 2019-02-25 VITALS — BP 122/85 | HR 79 | Ht 62.0 in | Wt 188.0 lb

## 2019-02-25 DIAGNOSIS — O165 Unspecified maternal hypertension, complicating the puerperium: Secondary | ICD-10-CM

## 2019-02-25 DIAGNOSIS — Z0130 Encounter for examination of blood pressure without abnormal findings: Secondary | ICD-10-CM

## 2019-02-25 NOTE — Progress Notes (Signed)
Patient presents to office today for BP check following MAU visit on 2/7 with vaginal delivery on 2/1. Patient reports some mild headaches but denies dizziness or blurry vision. She is taking her enalapril daily as prescribed. Patient reports BP was borderline elevated on Monday at home but was WNL yesterday. BP 122/85 today. No edema noted. Patient will have CBC and CMET labs today. Plan to follow up at pp visit on 3/10. Precautions given to patient.   Chase Caller RN BSN 02/25/19

## 2019-02-26 ENCOUNTER — Encounter: Payer: Self-pay | Admitting: Obstetrics and Gynecology

## 2019-02-26 DIAGNOSIS — R748 Abnormal levels of other serum enzymes: Secondary | ICD-10-CM | POA: Insufficient documentation

## 2019-02-26 LAB — COMPREHENSIVE METABOLIC PANEL
ALT: 32 IU/L (ref 0–32)
AST: 21 IU/L (ref 0–40)
Albumin/Globulin Ratio: 1.5 (ref 1.2–2.2)
Albumin: 4.1 g/dL (ref 3.8–4.8)
Alkaline Phosphatase: 121 IU/L — ABNORMAL HIGH (ref 39–117)
BUN/Creatinine Ratio: 13 (ref 9–23)
BUN: 13 mg/dL (ref 6–20)
Bilirubin Total: 0.3 mg/dL (ref 0.0–1.2)
CO2: 18 mmol/L — ABNORMAL LOW (ref 20–29)
Calcium: 9.3 mg/dL (ref 8.7–10.2)
Chloride: 106 mmol/L (ref 96–106)
Creatinine, Ser: 0.99 mg/dL (ref 0.57–1.00)
GFR calc Af Amer: 88 mL/min/{1.73_m2} (ref >59)
GFR calc non Af Amer: 76 mL/min/{1.73_m2} (ref >59)
Globulin, Total: 2.7 g/dL (ref 1.5–4.5)
Glucose: 87 mg/dL (ref 65–99)
Potassium: 4.5 mmol/L (ref 3.5–5.2)
Sodium: 144 mmol/L (ref 134–144)
Total Protein: 6.8 g/dL (ref 6.0–8.5)

## 2019-02-26 LAB — CBC
Hematocrit: 42 % (ref 34.0–46.6)
Hemoglobin: 14.2 g/dL (ref 11.1–15.9)
MCH: 30 pg (ref 26.6–33.0)
MCHC: 33.8 g/dL (ref 31.5–35.7)
MCV: 89 fL (ref 79–97)
Platelets: 321 10*3/uL (ref 150–450)
RBC: 4.74 x10E6/uL (ref 3.77–5.28)
RDW: 13.9 % (ref 11.7–15.4)
WBC: 4.4 10*3/uL (ref 3.4–10.8)

## 2019-03-02 NOTE — Progress Notes (Signed)
Patient seen and assessed by nursing staff during this encounter. I have reviewed the chart and agree with the documentation and plan.  Graycee Greeson, MD 03/02/2019 12:02 PM   

## 2019-03-03 ENCOUNTER — Ambulatory Visit (HOSPITAL_COMMUNITY): Payer: Medicaid Other

## 2019-03-03 ENCOUNTER — Encounter: Payer: Medicaid Other | Admitting: Obstetrics and Gynecology

## 2019-03-04 ENCOUNTER — Encounter: Payer: Medicaid Other | Admitting: Obstetrics & Gynecology

## 2019-03-24 ENCOUNTER — Other Ambulatory Visit: Payer: Self-pay

## 2019-03-25 ENCOUNTER — Encounter: Payer: Self-pay | Admitting: Student

## 2019-03-25 ENCOUNTER — Other Ambulatory Visit: Payer: Self-pay

## 2019-03-25 ENCOUNTER — Other Ambulatory Visit: Payer: Medicaid Other

## 2019-03-25 ENCOUNTER — Ambulatory Visit (INDEPENDENT_AMBULATORY_CARE_PROVIDER_SITE_OTHER): Payer: Medicaid Other | Admitting: Student

## 2019-03-25 DIAGNOSIS — O24415 Gestational diabetes mellitus in pregnancy, controlled by oral hypoglycemic drugs: Secondary | ICD-10-CM

## 2019-03-25 DIAGNOSIS — B3731 Acute candidiasis of vulva and vagina: Secondary | ICD-10-CM

## 2019-03-25 DIAGNOSIS — B373 Candidiasis of vulva and vagina: Secondary | ICD-10-CM

## 2019-03-25 DIAGNOSIS — O24435 Gestational diabetes mellitus in puerperium, controlled by oral hypoglycemic drugs: Secondary | ICD-10-CM

## 2019-03-25 DIAGNOSIS — O9883 Other maternal infectious and parasitic diseases complicating the puerperium: Secondary | ICD-10-CM

## 2019-03-25 MED ORDER — FLUCONAZOLE 150 MG PO TABS: 150.0000 mg | ORAL_TABLET | Freq: Every day | ORAL | 1 refills | Status: DC

## 2019-03-25 NOTE — Progress Notes (Signed)
Subjective:     Caitlin Terrell is a 34 y.o. female who presents for a postpartum visit. She is 5 weeks postpartum following a spontaneous vaginal delivery. I have fully reviewed the prenatal and intrapartum course. The delivery was at 37/2 gestational weeks. Outcome: spontaneous vaginal delivery. Anesthesia: none. Postpartum course has been normal. Baby's course has been normal. Baby is feeding by bottle - [employer] Good Start. Bleeding brown. Bowel function is normal. Bladder function is normal. Patient is not sexually active. Contraception method is tubal ligation. Postpartum depression screening: negative.  The following portions of the patient's history were reviewed and updated as appropriate: allergies, current medications, past family history, past medical history, past social history, past surgical history and problem list.  Review of Systems Pertinent items are noted in HPI.   Objective:    BP 125/82   Pulse 70   Ht 5\' 2"  (1.575 m)   Wt 198 lb 14.4 oz (90.2 kg)   Breastfeeding Yes   BMI 36.38 kg/m   General:  alert, cooperative and appears stated age  Lungs: clear to auscultation bilaterally  Heart:  regular rate and rhythm, S1, S2 normal, no murmur, click, rub or gallop  Abdomen: soft, non-tender; bowel sounds normal; no masses,  no organomegaly   Vulva:  not evaluated  Vagina: not evaluated        Assessment:     Normal postpartum exam. Pap smear not done at today's visit.   Plan:   1. Encounter for routine postpartum follow-up -doing well -stopped taking enalapril at 2 weeks PP. BP good today.  2. Gestational diabetes mellitus (GDM) controlled on oral hypoglycemic drug, antepartum -not fasting, has rescheduled for fasting labs on Friday  3. Vaginal yeast infection -reports symptoms consistent with yeast & requesting diflucan. Will reach out to Sunday if symptoms don't improve after 2 doses - fluconazole (DIFLUCAN) 150 MG tablet; Take 1 tablet (150 mg total) by mouth  daily.  Dispense: 1 tablet; Refill: 1   [place], NP

## 2019-03-25 NOTE — Patient Instructions (Signed)
Health Maintenance, Female Adopting a healthy lifestyle and getting preventive care are important in promoting health and wellness. Ask your health care provider about:  The right schedule for you to have regular tests and exams.  Things you can do on your own to prevent diseases and keep yourself healthy. What should I know about diet, weight, and exercise? Eat a healthy diet   Eat a diet that includes plenty of vegetables, fruits, low-fat dairy products, and lean protein.  Do not eat a lot of foods that are high in solid fats, added sugars, or sodium. Maintain a healthy weight Body mass index (BMI) is used to identify weight problems. It estimates body fat based on height and weight. Your health care provider can help determine your BMI and help you achieve or maintain a healthy weight. Get regular exercise Get regular exercise. This is one of the most important things you can do for your health. Most adults should:  Exercise for at least 150 minutes each week. The exercise should increase your heart rate and make you sweat (moderate-intensity exercise).  Do strengthening exercises at least twice a week. This is in addition to the moderate-intensity exercise.  Spend less time sitting. Even light physical activity can be beneficial. Watch cholesterol and blood lipids Have your blood tested for lipids and cholesterol at 32 years of age, then have this test every 5 years. Have your cholesterol levels checked more often if:  Your lipid or cholesterol levels are high.  You are older than 32 years of age.  You are at high risk for heart disease. What should I know about cancer screening? Depending on your health history and family history, you may need to have cancer screening at various ages. This may include screening for:  Breast cancer.  Cervical cancer.  Colorectal cancer.  Skin cancer.  Lung cancer. What should I know about heart disease, diabetes, and high blood  pressure? Blood pressure and heart disease  High blood pressure causes heart disease and increases the risk of stroke. This is more likely to develop in people who have high blood pressure readings, are of African descent, or are overweight.  Have your blood pressure checked: ? Every 3-5 years if you are 18-39 years of age. ? Every year if you are 40 years old or older. Diabetes Have regular diabetes screenings. This checks your fasting blood sugar level. Have the screening done:  Once every three years after age 40 if you are at a normal weight and have a low risk for diabetes.  More often and at a younger age if you are overweight or have a high risk for diabetes. What should I know about preventing infection? Hepatitis B If you have a higher risk for hepatitis B, you should be screened for this virus. Talk with your health care provider to find out if you are at risk for hepatitis B infection. Hepatitis C Testing is recommended for:  Everyone born from 1945 through 1965.  Anyone with known risk factors for hepatitis C. Sexually transmitted infections (STIs)  Get screened for STIs, including gonorrhea and chlamydia, if: ? You are sexually active and are younger than 32 years of age. ? You are older than 32 years of age and your health care provider tells you that you are at risk for this type of infection. ? Your sexual activity has changed since you were last screened, and you are at increased risk for chlamydia or gonorrhea. Ask your health care provider if   you are at risk.  Ask your health care provider about whether you are at high risk for HIV. Your health care provider may recommend a prescription medicine to help prevent HIV infection. If you choose to take medicine to prevent HIV, you should first get tested for HIV. You should then be tested every 3 months for as long as you are taking the medicine. Pregnancy  If you are about to stop having your period (premenopausal) and  you may become pregnant, seek counseling before you get pregnant.  Take 400 to 800 micrograms (mcg) of folic acid every day if you become pregnant.  Ask for birth control (contraception) if you want to prevent pregnancy. Osteoporosis and menopause Osteoporosis is a disease in which the bones lose minerals and strength with aging. This can result in bone fractures. If you are 65 years old or older, or if you are at risk for osteoporosis and fractures, ask your health care provider if you should:  Be screened for bone loss.  Take a calcium or vitamin D supplement to lower your risk of fractures.  Be given hormone replacement therapy (HRT) to treat symptoms of menopause. Follow these instructions at home: Lifestyle  Do not use any products that contain nicotine or tobacco, such as cigarettes, e-cigarettes, and chewing tobacco. If you need help quitting, ask your health care provider.  Do not use street drugs.  Do not share needles.  Ask your health care provider for help if you need support or information about quitting drugs. Alcohol use  Do not drink alcohol if: ? Your health care provider tells you not to drink. ? You are pregnant, may be pregnant, or are planning to become pregnant.  If you drink alcohol: ? Limit how much you use to 0-1 drink a day. ? Limit intake if you are breastfeeding.  Be aware of how much alcohol is in your drink. In the [place] one drink equals one 12 oz bottle of beer (355 mL), one 5 oz glass of wine (148 mL), or one 1 oz glass of hard liquor (44 mL). General instructions  Schedule regular health, dental, and eye exams.  Stay current with your vaccines.  Tell your health care provider if: ? You often feel depressed. ? You have ever been abused or do not feel safe at home. Summary  Adopting a healthy lifestyle and getting preventive care are important in promoting health and wellness.  Follow your health care provider's instructions about healthy  diet, exercising, and getting tested or screened for diseases.  Follow your health care provider's instructions on monitoring your cholesterol and blood pressure. This information is not intended to replace advice given to you by your health care provider. Make sure you discuss any questions you have with your health care provider. Document Revised: 12/25/2017 Document Reviewed: 12/25/2017 Elsevier Patient Education  2020 Elsevier Inc.  

## 2019-03-27 ENCOUNTER — Other Ambulatory Visit: Payer: Self-pay

## 2019-03-27 ENCOUNTER — Other Ambulatory Visit: Payer: Medicaid Other

## 2019-03-28 LAB — GLUCOSE TOLERANCE, 2 HOURS
Glucose, 2 hour: 117 mg/dL (ref 65–139)
Glucose, GTT - Fasting: 96 mg/dL (ref 65–99)

## 2019-07-07 ENCOUNTER — Ambulatory Visit (INDEPENDENT_AMBULATORY_CARE_PROVIDER_SITE_OTHER): Payer: Medicaid Other

## 2019-07-07 ENCOUNTER — Other Ambulatory Visit (HOSPITAL_COMMUNITY)
Admission: RE | Admit: 2019-07-07 | Discharge: 2019-07-07 | Disposition: A | Discharge status: Home / Self Care | Payer: Medicaid Other | Source: Ambulatory Visit | Attending: Family Medicine | Admitting: Family Medicine

## 2019-07-07 ENCOUNTER — Other Ambulatory Visit: Payer: Self-pay

## 2019-07-07 DIAGNOSIS — Z113 Encounter for screening for infections with a predominantly sexual mode of transmission: Secondary | ICD-10-CM

## 2019-07-07 NOTE — Progress Notes (Signed)
Pt here today for STD screening due to having unprotected sex.  Pt reports that she is also having some burning sensation and musky odor in her vaginal area.  Pt explained how to obtain self swab and that it will take approximately 24-48 hrs for results.  I also informed pt that we would contact her about abnormal results.  Pt stated that she has had some depression that she has been out of work with the helpful release of her HR at her employment.  Pt requested the information to where we referred her to in January.  Per chart review pt was scheduled with Dr. Thresa Ross in January.  I gave pt contact information to the office in [place] [phone]).  Pt stated thank you and verbalized understanding to what was discussed.  Addison Naegeli, RN  07/07/19

## 2019-07-07 NOTE — Progress Notes (Signed)
Agree with A & P. 

## 2019-07-08 LAB — CERVICOVAGINAL ANCILLARY ONLY
Bacterial Vaginitis (gardnerella): NEGATIVE
Candida Glabrata: NEGATIVE
Candida Vaginitis: NEGATIVE
Chlamydia: NEGATIVE
Comment: NEGATIVE
Comment: NEGATIVE
Comment: NEGATIVE
Comment: NEGATIVE
Comment: NEGATIVE
Comment: NORMAL
Neisseria Gonorrhea: NEGATIVE
Trichomonas: NEGATIVE

## 2019-09-04 ENCOUNTER — Ambulatory Visit: Payer: Medicaid Other

## 2019-09-18 ENCOUNTER — Ambulatory Visit: Payer: Medicaid Other

## 2019-10-20 ENCOUNTER — Encounter: Payer: Self-pay | Admitting: Family Medicine

## 2019-10-20 ENCOUNTER — Other Ambulatory Visit (HOSPITAL_COMMUNITY)
Admission: RE | Admit: 2019-10-20 | Discharge: 2019-10-20 | Disposition: A | Discharge status: Home / Self Care | Payer: Medicaid Other | Source: Ambulatory Visit | Attending: Family Medicine | Admitting: Family Medicine

## 2019-10-20 ENCOUNTER — Ambulatory Visit (INDEPENDENT_AMBULATORY_CARE_PROVIDER_SITE_OTHER): Payer: Medicaid Other | Admitting: *Deleted

## 2019-10-20 ENCOUNTER — Other Ambulatory Visit: Payer: Self-pay

## 2019-10-20 VITALS — BP 123/78 | HR 77 | Ht 62.0 in | Wt 172.0 lb

## 2019-10-20 DIAGNOSIS — Z9189 Other specified personal risk factors, not elsewhere classified: Secondary | ICD-10-CM

## 2019-10-20 DIAGNOSIS — N898 Other specified noninflammatory disorders of vagina: Secondary | ICD-10-CM

## 2019-10-20 DIAGNOSIS — Z113 Encounter for screening for infections with a predominantly sexual mode of transmission: Secondary | ICD-10-CM

## 2019-10-20 DIAGNOSIS — R102 Pelvic and perineal pain: Secondary | ICD-10-CM | POA: Diagnosis not present

## 2019-10-20 DIAGNOSIS — M545 Low back pain, unspecified: Secondary | ICD-10-CM | POA: Diagnosis not present

## 2019-10-20 DIAGNOSIS — N946 Dysmenorrhea, unspecified: Secondary | ICD-10-CM

## 2019-10-20 LAB — POCT URINALYSIS DIP (DEVICE)
Bilirubin Urine: NEGATIVE
Glucose, UA: NEGATIVE mg/dL
Hgb urine dipstick: NEGATIVE
Ketones, ur: NEGATIVE mg/dL
Leukocytes,Ua: NEGATIVE
Nitrite: NEGATIVE
Protein, ur: NEGATIVE mg/dL
Specific Gravity, Urine: >=1.03 (ref 1.005–1.030)
Urobilinogen, UA: 0.2 mg/dL (ref 0.0–1.0)
pH: 6 (ref 5.0–8.0)

## 2019-10-20 NOTE — Progress Notes (Signed)
Pt reports having low back pain x2 weeks and pelvic pressure during urination. She states these are the symptoms she gets with UTI and desires to be checked. Pt also requests testing for STI due to unprotected sex and having white vaginal discharge for 2 weeks. Self swab was obtained. Urinalysis was completed and was negative however due to pt's history of UTI's and desire for testing, culture was sent. Pt stated she has been having abnormal periods and pelvic pain and desires appointment for evaluation. I acknowledged that the appt can be scheduled upon check out today. She was advised she will be notified of test results and treatment indicated if any via MyChart. She voiced understanding.

## 2019-10-21 DIAGNOSIS — B379 Candidiasis, unspecified: Secondary | ICD-10-CM

## 2019-10-21 LAB — CERVICOVAGINAL ANCILLARY ONLY
Bacterial Vaginitis (gardnerella): POSITIVE — AB
Candida Glabrata: NEGATIVE
Candida Vaginitis: NEGATIVE
Chlamydia: NEGATIVE
Comment: NEGATIVE
Comment: NEGATIVE
Comment: NEGATIVE
Comment: NEGATIVE
Comment: NEGATIVE
Comment: NORMAL
Neisseria Gonorrhea: NEGATIVE
Trichomonas: NEGATIVE

## 2019-10-22 ENCOUNTER — Other Ambulatory Visit: Payer: Self-pay | Admitting: Lactation Services

## 2019-10-22 ENCOUNTER — Other Ambulatory Visit: Payer: Self-pay | Admitting: Advanced Practice Midwife

## 2019-10-22 MED ORDER — FLUCONAZOLE 150 MG PO TABS: 150.0000 mg | ORAL_TABLET | Freq: Once | ORAL | 0 refills | Status: AC

## 2019-10-22 MED ORDER — METRONIDAZOLE 500 MG PO TABS: 500.0000 mg | ORAL_TABLET | Freq: Two times a day (BID) | ORAL | 0 refills | Status: DC

## 2019-10-22 NOTE — Progress Notes (Signed)
Patient was assessed and managed by nursing staff during this encounter. I have reviewed the chart and agree with the documentation and plan. I have also made any necessary editorial changes.  Thressa Sheller DNP, CNM  10/22/19  11:08 AM

## 2019-10-22 NOTE — Progress Notes (Signed)
Diflucan sent to pharmacy to follow after Flagyl as patient with history of yeast infections post ATB.

## 2019-10-23 ENCOUNTER — Other Ambulatory Visit: Payer: Self-pay | Admitting: Advanced Practice Midwife

## 2019-10-23 LAB — URINE CULTURE

## 2019-10-23 MED ORDER — NITROFURANTOIN MONOHYD MACRO 100 MG PO CAPS: 100.0000 mg | ORAL_CAPSULE | Freq: Two times a day (BID) | ORAL | 0 refills | Status: AC

## 2019-11-12 MED ORDER — FLUCONAZOLE 150 MG PO TABS: 150.0000 mg | ORAL_TABLET | Freq: Once | ORAL | 0 refills | Status: AC

## 2019-11-25 ENCOUNTER — Telehealth: Payer: Self-pay | Admitting: Family Medicine

## 2019-11-25 NOTE — Telephone Encounter (Signed)
Spoke with patient about her Medicaid needing to be changed inorder for her to be seen at any [facility].

## 2019-11-27 ENCOUNTER — Ambulatory Visit: Payer: Medicaid Other | Admitting: Family Medicine

## 2019-12-24 ENCOUNTER — Other Ambulatory Visit (HOSPITAL_COMMUNITY)
Admission: RE | Admit: 2019-12-24 | Discharge: 2019-12-24 | Disposition: A | Discharge status: Home / Self Care | Payer: Medicaid Other | Source: Ambulatory Visit | Attending: Family Medicine | Admitting: Family Medicine

## 2019-12-24 ENCOUNTER — Ambulatory Visit (INDEPENDENT_AMBULATORY_CARE_PROVIDER_SITE_OTHER): Payer: Medicaid Other | Admitting: *Deleted

## 2019-12-24 ENCOUNTER — Encounter: Payer: Self-pay | Admitting: *Deleted

## 2019-12-24 VITALS — BP 116/63 | HR 66 | Ht 62.0 in | Wt 167.0 lb

## 2019-12-24 DIAGNOSIS — N898 Other specified noninflammatory disorders of vagina: Secondary | ICD-10-CM | POA: Diagnosis not present

## 2019-12-24 NOTE — Progress Notes (Signed)
Pt presents with c/o vaginal irritation, itching, odor and discharge. She stated that she was recently treated (October) for BV and is having some of the same previous sx. Self swab was obtained. Pt was advised that she will be notified of results as well as treatment indicated if any via Mychart. She voiced understanding.

## 2019-12-25 LAB — CERVICOVAGINAL ANCILLARY ONLY
Bacterial Vaginitis (gardnerella): POSITIVE — AB
Candida Glabrata: NEGATIVE
Candida Vaginitis: NEGATIVE
Chlamydia: NEGATIVE
Comment: NEGATIVE
Comment: NEGATIVE
Comment: NEGATIVE
Comment: NEGATIVE
Comment: NEGATIVE
Comment: NORMAL
Neisseria Gonorrhea: NEGATIVE
Trichomonas: NEGATIVE

## 2019-12-28 ENCOUNTER — Encounter: Payer: Self-pay | Admitting: General Practice

## 2019-12-28 ENCOUNTER — Telehealth: Payer: Self-pay | Admitting: Family Medicine

## 2019-12-28 ENCOUNTER — Other Ambulatory Visit: Payer: Self-pay | Admitting: Student

## 2019-12-28 MED ORDER — METRONIDAZOLE 500 MG PO TABS: 500.0000 mg | ORAL_TABLET | Freq: Two times a day (BID) | ORAL | 0 refills | Status: DC

## 2019-12-28 NOTE — Telephone Encounter (Signed)
Patient said she need Med called in for her BV

## 2019-12-31 NOTE — Progress Notes (Signed)
Chart reviewed for nurse visit. Agree with plan of care.   Domanique Huesman Lorraine, CNM 12/31/2019 7:19 PM   

## 2020-01-19 ENCOUNTER — Ambulatory Visit (INDEPENDENT_AMBULATORY_CARE_PROVIDER_SITE_OTHER): Payer: Medicaid Other | Admitting: Advanced Practice Midwife

## 2020-01-19 ENCOUNTER — Encounter: Payer: Self-pay | Admitting: Advanced Practice Midwife

## 2020-01-19 ENCOUNTER — Other Ambulatory Visit (HOSPITAL_COMMUNITY)
Admission: RE | Admit: 2020-01-19 | Discharge: 2020-01-19 | Disposition: A | Discharge status: Home / Self Care | Payer: Medicaid Other | Source: Ambulatory Visit | Attending: Advanced Practice Midwife | Admitting: Advanced Practice Midwife

## 2020-01-19 ENCOUNTER — Other Ambulatory Visit: Payer: Self-pay

## 2020-01-19 VITALS — BP 132/74 | HR 80 | Wt 168.4 lb

## 2020-01-19 DIAGNOSIS — N939 Abnormal uterine and vaginal bleeding, unspecified: Secondary | ICD-10-CM | POA: Diagnosis not present

## 2020-01-19 DIAGNOSIS — N898 Other specified noninflammatory disorders of vagina: Secondary | ICD-10-CM

## 2020-01-19 DIAGNOSIS — Z9851 Tubal ligation status: Secondary | ICD-10-CM | POA: Diagnosis not present

## 2020-01-19 MED ORDER — FLUCONAZOLE 150 MG PO TABS: 150.0000 mg | ORAL_TABLET | Freq: Once | ORAL | 0 refills | Status: AC

## 2020-01-19 MED ORDER — IBUPROFEN 600 MG PO TABS: 600.0000 mg | ORAL_TABLET | Freq: Four times a day (QID) | ORAL | 3 refills | Status: DC | PRN

## 2020-01-19 NOTE — Progress Notes (Signed)
GYNECOLOGY PROBLEM VISIT NOTE  History:     Caitlin Terrell is a 35 y.o. G36P2002 female here for evaluation of AUB in the setting of tubal ligation with filshie clips on 02/16/2019.  Current complaints: heavier menstrual periods and prolonged cramping. She denies dizziness, weakness, SOB, syncope. She is changing pads more frequently but not saturating them in short amounts of time. She verbalizes interest in a complete hysterectomy sometime in the future.  Patient states she has been cramping every day for the past month, denies cramping this morning. She is managing her discomfort with a few remaining 800 mg Motrin from her SVD 02/2019. She requests a prescription for Motrin 600 mg. Denies abnormal vaginal bleeding, discharge, pelvic pain, problems with intercourse or other gynecologic concerns.    Gynecologic History Patient's last menstrual period was 12/25/2019 (approximate). Contraception: tubal ligation and condom use Last Pap: 08/2018. Results were: normal with negative HPV Last mammogram: N/A age 50  Obstetric History OB History  Gravida Para Term Preterm AB Living  2 2 2     2   SAB IAB Ectopic Multiple Live Births        0 2    # Outcome Date GA Lbr Len/2nd Weight Sex Delivery Anes PTL Lv  2 Term 02/16/19 [redacted]w[redacted]d 05:06 / 00:04 7 lb 4.4 oz (3.3 kg) F Vag-Spont None  LIV  1 Term 08/14/08 [redacted]w[redacted]d  7 lb 12 oz (3.515 kg) M Vag-Spont   LIV    Past Medical History:  Diagnosis Date  . Anxiety   . Asthma   . Environmental allergies   . Gestational diabetes   . Mental disorder    Bipolar    Past Surgical History:  Procedure Laterality Date  . 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 COLONOSCOPY    . KNEE SURGERY    . TONSILLECTOMY    . TUBAL LIGATION N/A 02/16/2019   Procedure: POST PARTUM TUBAL LIGATION;  Surgeon: 04/16/2019, MD;  Location: [facility];  Service: Gynecology;  Laterality: N/A;    Current Outpatient Medications on File Prior to Visit   Medication Sig Dispense Refill  . albuterol (PROVENTIL) (2.5 MG/3ML) 0.083% nebulizer solution Take 3 mLs (2.5 mg total) by nebulization every 6 (six) hours as needed for wheezing or shortness of breath. 75 mL 12  . albuterol (VENTOLIN HFA) 108 (90 Base) MCG/ACT inhaler Inhale 1-2 puffs into the lungs every 6 (six) hours as needed for wheezing or shortness of breath. 6.7 g 3  . acetaminophen (TYLENOL) 325 MG tablet Take 2 tablets (650 mg total) by mouth every 4 (four) hours as needed for mild pain or headache (for pain scale < 4). (Patient not taking: No sig reported) 30 tablet 0  . cetirizine (ZYRTEC ALLERGY) 10 MG tablet Take 1 tablet (10 mg total) by mouth daily. (Patient not taking: No sig reported) 30 tablet 11   No current facility-administered medications on file prior to visit.    Allergies  Allergen Reactions  . Amoxicillin Other (See Comments)    " I don't do well with cillins"  . Other Hives    "Orange herbal spice tea"  . Sulfa Antibiotics Hives    Social History:  reports that she has never smoked. She has never used smokeless tobacco. She reports previous alcohol use. She reports previous drug use. Drug: Marijuana.  Family History  Problem Relation Age of Onset  . Hypertension Mother   . Diabetes  Mother   . Mental illness Mother   . Heart disease Father     The following portions of the patient's history were reviewed and updated as appropriate: allergies, current medications, past family history, past medical history, past social history, past surgical history and problem list.  Review of Systems Pertinent items noted in HPI and remainder of comprehensive ROS otherwise negative.  Physical Exam:  BP 132/74   Pulse 80   Wt 168 lb 6.4 oz (76.4 kg)   LMP 12/25/2019 (Approximate)   Breastfeeding No   BMI 30.80 kg/m  CONSTITUTIONAL: Well-developed, well-nourished female in no acute distress.  HENT:  Normocephalic, atraumatic, External right and left ear normal.   EYES: Conjunctivae and EOM are normal. Pupils are equal, round, and reactive to light. No scleral icterus.  NECK: Normal range of motion, supple, no masses.  SKIN: Skin is warm and dry. No rash noted. Not diaphoretic. No erythema. No pallor. MUSCULOSKELETAL: Normal range of motion. No tenderness.  No cyanosis, clubbing, or edema.   NEUROLOGIC: Alert and oriented to person, place, and time.  PSYCHIATRIC: Normal mood and affect. Normal behavior. Normal judgment and thought content. CARDIOVASCULAR: Normal heart rate noted RESPIRATORY: Effort and breath sounds normal, no problems with respiration noted. PELVIC: Deferred, patient's children present. Patient self swabbed   Assessment and Plan:    1. Vaginal discharge - Swab collected - Will treat for yeast, rx to pharmacy  2. Hx of tubal ligation 02/16/2019  3. AUB --Discussed increased bleeding as expected side effect after tubal. Discussed management options for abnormal uterine bleeding including NSAIDs (Naproxen), tranexamic acid (Lysteda), oral progesterone, Depo Provera, Levonogestrel IUD.  Discussed risks and benefits of each method.   Patient desires time to contemplate alternates, Motrin prescribed with adjusted dose per patient request   Routine preventative health maintenance measures emphasized. Please refer to After Visit Summary for other counseling recommendations.      Clayton Bibles, MSN, CNM Certified Nurse Midwife, [employer] for [employer], [facility] 01/19/20 9:05 AM

## 2020-01-19 NOTE — Patient Instructions (Signed)
Surgery to Prevent Pregnancy Female sterilization is surgery to prevent pregnancy. In this surgery, the fallopian tubes are either blocked or closed off. When the fallopian tubes are closed, the eggs that the ovaries release cannot enter the uterus, sperm cannot reach the eggs, and you cannot get pregnant. Sterilization is permanent. It should only be done if you are sure that you do not want to be able to have children. What are the sterilization surgery options? There are several kinds of female sterilization surgeries. They include:  Laparoscopic tubal ligation. In this surgery, the fallopian tubes are tied off, sealed with heat, or blocked with a clip, ring, or clamp. A small portion of each fallopian tube may also be removed. This surgery is done through several small cuts (incisions) with special instruments that are inserted into your abdomen.  Postpartum tubal ligation. This is also called a mini-laparotomy. This surgery is done right after childbirth or 1 or 2 days after childbirth. In this surgery, the fallopian tubes are tied off, sealed with heat, or blocked with a clip, ring, or clamp. A small portion of each fallopian tube may also be removed. The surgery is done through a single incision in the abdomen.  Tubal ligation during a C-section. In this surgery, the fallopian tubes are tied off, sealed with heat, or blocked with a clip, ring, or clamp. A small portion of each fallopian tube may also be removed. The surgery is done at the same time as a C-section delivery. Is sterilization safe? Generally, sterilization is safe. Complications are rare. However, there are risks. They include:  Bleeding.  Infection.  Reaction to medicine used during the procedure.  Injury to surrounding organs.  Failure of the procedure. How effective is sterilization? Sterilization is nearly 100% effective, but it can fail. In rare cases, the fallopian tubes can grow back together over time. If this  happens, pregnancy may be possible and you will be able to get pregnant again. Women who have had this procedure have a higher chance of having an ectopic pregnancy. An ectopic pregnancy is a pregnancy that happens outside of the uterus. This kind of pregnancy can lead to serious bleeding if it is not treated. What are the benefits?  It is usually effective for a lifetime.  It is usually safe.  It does not have the drawbacks of other types of birth control in that your hormones are not affected. Because of this, your menstrual periods, sexual desire, and sexual performance will not be affected. What are the drawbacks?  You will need to recover and may have complications after surgery.  If you change your mind and decide that you want to have children, you may not be able to. Sterilization may be reversed, but a reversal is not always successful.  It does not provide protection against STDs (sexually transmitted diseases).  It increases the chance of having an ectopic pregnancy. Follow these instructions at home:  Keep all follow-up visits as told by your health care provider. This is important. Summary  Female sterilization is surgery to prevent pregnancy.  There are different types of female sterilization surgeries.  Sterilization may be reversed, but a reversal is not always successful.  Sterilization does not protect against STDs. This information is not intended to replace advice given to you by your health care provider. Make sure you discuss any questions you have with your health care provider. Document Revised: 06/18/2018 Document Reviewed: 09/13/2017 Elsevier Patient Education  2020 Elsevier Inc.  

## 2020-01-20 LAB — CERVICOVAGINAL ANCILLARY ONLY
Bacterial Vaginitis (gardnerella): NEGATIVE
Candida Glabrata: NEGATIVE
Candida Vaginitis: NEGATIVE
Comment: NEGATIVE
Comment: NEGATIVE
Comment: NEGATIVE

## 2020-04-05 ENCOUNTER — Ambulatory Visit: Payer: Medicaid Other

## 2020-04-19 ENCOUNTER — Other Ambulatory Visit (HOSPITAL_COMMUNITY)
Admission: RE | Admit: 2020-04-19 | Discharge: 2020-04-19 | Disposition: A | Discharge status: Home / Self Care | Payer: Medicaid Other | Source: Ambulatory Visit | Attending: Advanced Practice Midwife | Admitting: Advanced Practice Midwife

## 2020-04-19 ENCOUNTER — Encounter: Payer: Self-pay | Admitting: Advanced Practice Midwife

## 2020-04-19 ENCOUNTER — Other Ambulatory Visit: Payer: Self-pay

## 2020-04-19 ENCOUNTER — Ambulatory Visit (INDEPENDENT_AMBULATORY_CARE_PROVIDER_SITE_OTHER): Payer: Medicaid Other | Admitting: Advanced Practice Midwife

## 2020-04-19 VITALS — BP 117/78 | HR 96 | Ht 62.0 in | Wt 167.0 lb

## 2020-04-19 DIAGNOSIS — Z01419 Encounter for gynecological examination (general) (routine) without abnormal findings: Secondary | ICD-10-CM | POA: Insufficient documentation

## 2020-04-19 MED ORDER — FLUCONAZOLE 150 MG PO TABS: 150.0000 mg | ORAL_TABLET | Freq: Once | ORAL | 0 refills | Status: AC

## 2020-04-19 NOTE — Progress Notes (Signed)
GYNECOLOGY ANNUAL PREVENTATIVE CARE ENCOUNTER NOTE  Subjective:   Caitlin Terrell is a 35 y.o. G37P2002 female here for a routine annual gynecologic exam.  Current complaints: vaginal itching. She has been on abx recently due to sinus infection, and thinks she may have yeast now.   Denies abnormal vaginal bleeding, discharge, pelvic pain, problems with intercourse or other gynecologic concerns.   Patient had a period, then had some bleeding that was like brown spotting for about 2 weeks after period.    Gynecologic History Patient's last menstrual period was 04/15/2020. Contraception: tubal ligation 02/2019 Last Pap: 08/2018. Results were: normal Last mammogram: NA, age   Obstetric History OB History  Gravida Para Term Preterm AB Living  2 2 2     2   SAB IAB Ectopic Multiple Live Births        0 2    # Outcome Date GA Lbr Len/2nd Weight Sex Delivery Anes PTL Lv  2 Term 02/16/19 [redacted]w[redacted]d 05:06 / 00:04 7 lb 4.4 oz (3.3 kg) F Vag-Spont None  LIV  1 Term 08/14/08 [redacted]w[redacted]d  7 lb 12 oz (3.515 kg) M Vag-Spont   LIV    Past Medical History:  Diagnosis Date  . Anxiety   . Asthma   . Environmental allergies   . Gestational diabetes   . Mental disorder    Bipolar    Past Surgical History:  Procedure Laterality Date  . 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 COLONOSCOPY    . KNEE SURGERY    . TONSILLECTOMY    . TUBAL LIGATION N/A 02/16/2019   Procedure: POST PARTUM TUBAL LIGATION;  Surgeon: 04/16/2019, MD;  Location: [facility] LD ORS;  Service: Gynecology;  Laterality: N/A;    Current Outpatient Medications on File Prior to Visit  Medication Sig Dispense Refill  . albuterol (PROVENTIL) (2.5 MG/3ML) 0.083% nebulizer solution Take 3 mLs (2.5 mg total) by nebulization every 6 (six) hours as needed for wheezing or shortness of breath. 75 mL 12  . albuterol (VENTOLIN HFA) 108 (90 Base) MCG/ACT inhaler Inhale 1-2 puffs into the lungs every 6 (six) hours as needed for wheezing or shortness  of breath. 6.7 g 3  . ibuprofen (ADVIL) 600 MG tablet Take 1 tablet (600 mg total) by mouth every 6 (six) hours as needed. 60 tablet 3  . acetaminophen (TYLENOL) 325 MG tablet Take 2 tablets (650 mg total) by mouth every 4 (four) hours as needed for mild pain or headache (for pain scale < 4). (Patient not taking: No sig reported) 30 tablet 0  . cetirizine (ZYRTEC ALLERGY) 10 MG tablet Take 1 tablet (10 mg total) by mouth daily. (Patient not taking: No sig reported) 30 tablet 11   No current facility-administered medications on file prior to visit.    Allergies  Allergen Reactions  . Amoxicillin Other (See Comments)    " I don't do well with cillins"  . Other Hives    "Orange herbal spice tea"  . Sulfa Antibiotics Hives    Social History   Socioeconomic History  . Marital status: Divorced    Spouse name: Not on file  . Number of children: Not on file  . Years of education: Not on file  . Highest education level: Not on file  Occupational History    Employer: [employer]  Tobacco Use  . Smoking status: Never Smoker  . Smokeless tobacco: Never Used  Vaping Use  . Vaping Use: Never  used  Substance and Sexual Activity  . Alcohol use: Yes    Comment: occ  . Drug use: Not Currently    Types: Marijuana    Comment: last use June 2020  . Sexual activity: Yes  Other Topics Concern  . Not on file  Social History Narrative  . Not on file   Social Determinants of Health   Financial Resource Strain: Not on file  Food Insecurity: No Food Insecurity  . Worried About Programme researcher, broadcasting/film/video in the Last Year: Never true  . Ran Out of Food in the Last Year: Never true  Transportation Needs: No Transportation Needs  . Lack of Transportation (Medical): No  . Lack of Transportation (Non-Medical): No  Physical Activity: Not on file  Stress: Not on file  Social Connections: Not on file  Intimate Partner Violence: Not on file    Family History  Problem Relation Age of Onset  .  Hypertension Mother   . Diabetes Mother   . Mental illness Mother   . Heart disease Father     The following portions of the patient's history were reviewed and updated as appropriate: allergies, current medications, past family history, past medical history, past social history, past surgical history and problem list.  Review of Systems Pertinent items noted in HPI and remainder of comprehensive ROS otherwise negative.   Objective:  BP 117/78   Pulse 96   Ht 5\' 2"  (1.575 m)   Wt 167 lb (75.8 kg)   LMP 04/15/2020   BMI 30.54 kg/m  CONSTITUTIONAL: Well-developed, well-nourished female in no acute distress.  HENT:  Normocephalic, atraumatic, External right and left ear normal. Oropharynx is clear and moist EYES: Conjunctivae and EOM are normal. Pupils are equal, round, and reactive to light. No scleral icterus.  NECK: Normal range of motion, supple, no masses.  Normal thyroid.  SKIN: Skin is warm and dry. No rash noted. Not diaphoretic. No erythema. No pallor. NEUROLOGIC: Alert and oriented to person, place, and time. Normal reflexes, muscle tone coordination. No cranial nerve deficit noted. PSYCHIATRIC: Normal mood and affect. Normal behavior. Normal judgment and thought content. CARDIOVASCULAR: Normal heart rate noted, regular rhythm RESPIRATORY: Clear to auscultation bilaterally. Effort and breath sounds normal, no problems with respiration noted. ABDOMEN: Soft, normal bowel sounds, no distention noted.  No tenderness, rebound or guarding.  MUSCULOSKELETAL: Normal range of motion. No tenderness.  No cyanosis, clubbing, or edema.  2+ distal pulses.   Assessment and Plan:  1. Well woman exam - Cervicovaginal ancillary only( [place]) - HIV antibody (with reflex) - RPR - Hepatitis C Antibody   Routine preventative health maintenance measures emphasized. Please refer to After Visit Summary for other counseling recommendations.   06/15/2020 DNP, CNM  04/19/20  9:57 AM

## 2020-04-20 LAB — CERVICOVAGINAL ANCILLARY ONLY
Bacterial Vaginitis (gardnerella): POSITIVE — AB
Candida Glabrata: NEGATIVE
Candida Vaginitis: NEGATIVE
Chlamydia: NEGATIVE
Comment: NEGATIVE
Comment: NEGATIVE
Comment: NEGATIVE
Comment: NEGATIVE
Comment: NEGATIVE
Comment: NORMAL
Neisseria Gonorrhea: NEGATIVE
Trichomonas: NEGATIVE

## 2020-04-20 LAB — HIV ANTIBODY (ROUTINE TESTING W REFLEX): HIV Screen 4th Generation wRfx: NONREACTIVE

## 2020-04-20 LAB — HEPATITIS C ANTIBODY: Hep C Virus Ab: <0.1 s/co ratio (ref 0.0–0.9)

## 2020-04-20 LAB — RPR: RPR Ser Ql: NONREACTIVE

## 2020-04-21 ENCOUNTER — Other Ambulatory Visit: Payer: Self-pay

## 2020-04-21 DIAGNOSIS — B9689 Other specified bacterial agents as the cause of diseases classified elsewhere: Secondary | ICD-10-CM

## 2020-04-21 MED ORDER — METRONIDAZOLE 500 MG PO TABS: 500.0000 mg | ORAL_TABLET | Freq: Two times a day (BID) | ORAL | 0 refills | Status: DC

## 2020-06-02 ENCOUNTER — Ambulatory Visit (INDEPENDENT_AMBULATORY_CARE_PROVIDER_SITE_OTHER): Payer: Medicaid Other | Admitting: *Deleted

## 2020-06-02 ENCOUNTER — Other Ambulatory Visit: Payer: Self-pay

## 2020-06-02 ENCOUNTER — Other Ambulatory Visit (HOSPITAL_COMMUNITY)
Admission: RE | Admit: 2020-06-02 | Discharge: 2020-06-02 | Disposition: A | Discharge status: Home / Self Care | Payer: Medicaid Other | Source: Ambulatory Visit | Attending: Family Medicine | Admitting: Family Medicine

## 2020-06-02 VITALS — BP 136/88 | HR 98 | Ht 62.5 in | Wt 160.5 lb

## 2020-06-02 DIAGNOSIS — Z202 Contact with and (suspected) exposure to infections with a predominantly sexual mode of transmission: Secondary | ICD-10-CM | POA: Diagnosis not present

## 2020-06-02 DIAGNOSIS — R102 Pelvic and perineal pain: Secondary | ICD-10-CM | POA: Diagnosis not present

## 2020-06-02 DIAGNOSIS — R109 Unspecified abdominal pain: Secondary | ICD-10-CM | POA: Diagnosis not present

## 2020-06-02 LAB — POCT URINALYSIS DIP (DEVICE)
Bilirubin Urine: NEGATIVE
Glucose, UA: NEGATIVE mg/dL
Hgb urine dipstick: NEGATIVE
Ketones, ur: NEGATIVE mg/dL
Leukocytes,Ua: NEGATIVE
Nitrite: NEGATIVE
Protein, ur: NEGATIVE mg/dL
Specific Gravity, Urine: >=1.03 (ref 1.005–1.030)
Urobilinogen, UA: 0.2 mg/dL (ref 0.0–1.0)
pH: 6 (ref 5.0–8.0)

## 2020-06-02 NOTE — Progress Notes (Signed)
Patient was assessed and managed by nursing staff during this encounter. I have reviewed the chart and agree with the documentation and plan. I have also made any necessary editorial changes.  Warden Fillers, MD 06/02/2020 9:51 AM

## 2020-06-02 NOTE — Progress Notes (Signed)
Here for nurse visit . Wants to do self swab to check for std's because has new partner. Also wants to check for UTI because she is having lower back pain, and pelvic pain. Will do self swab and UA.Explained will treat according to results.She voices understanding. UA negative. Patient request urine culture because she states feels like previous UTI.  She voices understanding.  Zacharie Portner,RN

## 2020-06-03 LAB — CERVICOVAGINAL ANCILLARY ONLY
Bacterial Vaginitis (gardnerella): NEGATIVE
Candida Glabrata: NEGATIVE
Candida Vaginitis: POSITIVE — AB
Chlamydia: NEGATIVE
Comment: NEGATIVE
Comment: NEGATIVE
Comment: NEGATIVE
Comment: NEGATIVE
Comment: NEGATIVE
Comment: NORMAL
Neisseria Gonorrhea: NEGATIVE
Trichomonas: NEGATIVE

## 2020-06-04 LAB — URINE CULTURE: Organism ID, Bacteria: NO GROWTH

## 2020-06-06 ENCOUNTER — Other Ambulatory Visit: Payer: Self-pay | Admitting: Lactation Services

## 2020-06-06 MED ORDER — FLUCONAZOLE 150 MG PO TABS: 150.0000 mg | ORAL_TABLET | Freq: Once | ORAL | 0 refills | Status: AC

## 2020-06-07 ENCOUNTER — Telehealth: Payer: Self-pay

## 2020-06-07 DIAGNOSIS — B379 Candidiasis, unspecified: Secondary | ICD-10-CM

## 2020-06-07 MED ORDER — FLUCONAZOLE 150 MG PO TABS: 150.0000 mg | ORAL_TABLET | Freq: Once | ORAL | 0 refills | Status: AC

## 2020-06-07 NOTE — Telephone Encounter (Addendum)
-----   Message from Warden Fillers, MD sent at 06/07/2020 11:06 AM EDT ----- Vaginal swab showed yeast infection, will offer treatment   Called pt. Results given. Pt requests treatment x 2 as 1 Diflucan course does not clear up yeast. Verbal order from [place], MD for Diflucan 150 mg once, repeat in 3 days.

## 2020-08-09 ENCOUNTER — Other Ambulatory Visit: Payer: Self-pay

## 2020-08-09 ENCOUNTER — Other Ambulatory Visit (HOSPITAL_COMMUNITY)
Admission: RE | Admit: 2020-08-09 | Discharge: 2020-08-09 | Disposition: A | Discharge status: Home / Self Care | Payer: Medicaid Other | Source: Ambulatory Visit | Attending: Family Medicine | Admitting: Family Medicine

## 2020-08-09 ENCOUNTER — Ambulatory Visit (INDEPENDENT_AMBULATORY_CARE_PROVIDER_SITE_OTHER): Payer: Medicaid Other

## 2020-08-09 VITALS — BP 108/67 | HR 67 | Wt 164.3 lb

## 2020-08-09 DIAGNOSIS — Z113 Encounter for screening for infections with a predominantly sexual mode of transmission: Secondary | ICD-10-CM | POA: Insufficient documentation

## 2020-08-09 NOTE — Progress Notes (Signed)
Pt here today with report of have recent intercourse with a new partner and she is requesting to have a STD screening.  Pt also reports having vaginal itching on the outside of her labia-added BV and yeast to self swab.  Pt explained how to obtain self swab and that we will call her with abnormal results.   Pt verbalized understanding.    Caitlin Terrell  08/09/20

## 2020-08-09 NOTE — Progress Notes (Signed)
Chart reviewed for nurse visit. Agree with plan of care.   Sayla Golonka M, MD 08/09/20 12:15 PM 

## 2020-08-10 DIAGNOSIS — B379 Candidiasis, unspecified: Secondary | ICD-10-CM

## 2020-08-10 DIAGNOSIS — T3695XA Adverse effect of unspecified systemic antibiotic, initial encounter: Secondary | ICD-10-CM

## 2020-08-10 LAB — CERVICOVAGINAL ANCILLARY ONLY
Bacterial Vaginitis (gardnerella): POSITIVE — AB
Candida Glabrata: NEGATIVE
Candida Vaginitis: NEGATIVE
Chlamydia: NEGATIVE
Comment: NEGATIVE
Comment: NEGATIVE
Comment: NEGATIVE
Comment: NEGATIVE
Comment: NEGATIVE
Comment: NORMAL
Neisseria Gonorrhea: NEGATIVE
Trichomonas: NEGATIVE

## 2020-08-11 ENCOUNTER — Other Ambulatory Visit: Payer: Self-pay | Admitting: *Deleted

## 2020-08-11 MED ORDER — METRONIDAZOLE 500 MG PO TABS: 500.0000 mg | ORAL_TABLET | Freq: Two times a day (BID) | ORAL | 0 refills | Status: DC

## 2020-08-12 MED ORDER — FLUCONAZOLE 150 MG PO TABS: 150.0000 mg | ORAL_TABLET | Freq: Once | ORAL | 0 refills | Status: AC

## 2020-10-11 ENCOUNTER — Other Ambulatory Visit (HOSPITAL_COMMUNITY)
Admission: RE | Admit: 2020-10-11 | Discharge: 2020-10-11 | Disposition: A | Discharge status: Home / Self Care | Payer: Medicaid Other | Source: Ambulatory Visit | Attending: Family Medicine | Admitting: Family Medicine

## 2020-10-11 ENCOUNTER — Ambulatory Visit (INDEPENDENT_AMBULATORY_CARE_PROVIDER_SITE_OTHER): Payer: Medicaid Other

## 2020-10-11 ENCOUNTER — Other Ambulatory Visit: Payer: Self-pay

## 2020-10-11 VITALS — BP 101/70 | HR 76 | Ht 62.0 in | Wt 169.9 lb

## 2020-10-11 DIAGNOSIS — N898 Other specified noninflammatory disorders of vagina: Secondary | ICD-10-CM | POA: Diagnosis present

## 2020-10-11 MED ORDER — METRONIDAZOLE 500 MG PO TABS: 500.0000 mg | ORAL_TABLET | Freq: Two times a day (BID) | ORAL | 0 refills | Status: DC

## 2020-10-11 NOTE — Progress Notes (Addendum)
Pt here today for self swab. Pt states having vaginal discharge with odor x 1 week. Pt states has hx of BV every 3 months. Last BV was 07/2020. Pt advised to have self swab today. Self swab collected today. Pt advised results will take 24-48 hours and will see results in mychart and will be notified if needs further treatment. Pt also given Rx Flagyl per protocol given her symptoms. Pt advised after this treatment to use Boric acid suppositories vaginally only to see if this helps. Pt verbalized understanding and agreeable to plan of care. Pt aware of next Annual exam due in April 2023. Judeth Cornfield, RN    Chart reviewed for nurse visit. Agree with plan of care.   Currie Paris, NP 10/12/2020 12:24 PM

## 2020-10-13 LAB — CERVICOVAGINAL ANCILLARY ONLY
Bacterial Vaginitis (gardnerella): POSITIVE — AB
Candida Glabrata: NEGATIVE
Candida Vaginitis: NEGATIVE
Chlamydia: NEGATIVE
Comment: NEGATIVE
Comment: NEGATIVE
Comment: NEGATIVE
Comment: NEGATIVE
Comment: NEGATIVE
Comment: NORMAL
Neisseria Gonorrhea: NEGATIVE
Trichomonas: NEGATIVE

## 2020-10-19 ENCOUNTER — Other Ambulatory Visit: Payer: Self-pay

## 2020-10-31 ENCOUNTER — Ambulatory Visit: Payer: Medicaid Other

## 2020-10-31 ENCOUNTER — Other Ambulatory Visit: Payer: Self-pay

## 2020-10-31 ENCOUNTER — Ambulatory Visit (INDEPENDENT_AMBULATORY_CARE_PROVIDER_SITE_OTHER): Payer: Medicaid Other

## 2020-10-31 VITALS — BP 118/69 | HR 79 | Wt 162.4 lb

## 2020-10-31 DIAGNOSIS — R109 Unspecified abdominal pain: Secondary | ICD-10-CM

## 2020-10-31 DIAGNOSIS — R10A0 Flank pain, unspecified side: Secondary | ICD-10-CM

## 2020-10-31 LAB — POCT URINALYSIS DIP (DEVICE)
Bilirubin Urine: NEGATIVE
Glucose, UA: NEGATIVE mg/dL
Ketones, ur: NEGATIVE mg/dL
Nitrite: NEGATIVE
Protein, ur: NEGATIVE mg/dL
Specific Gravity, Urine: <=1.005 (ref 1.005–1.030)
Urobilinogen, UA: 0.2 mg/dL (ref 0.0–1.0)
pH: 6 (ref 5.0–8.0)

## 2020-10-31 NOTE — Patient Instructions (Signed)
  [facility] [address] [place],  [place]  [address] Get Driving Directions Main: [phone]  Sunday:Closed Monday:8:00 AM - 5:00 PM Tuesday:8:00 AM - 5:00 PM Wednesday:8:00 AM - 5:00 PM Thursday:8:00 AM - 5:00 PM Friday:8:00 AM - 5:00 PM Saturday:Closed Closed: New Year's Day, Good Friday, Memorial Day, July 4th, Thanksgiving Day, Christmas Day

## 2020-10-31 NOTE — Progress Notes (Signed)
Pt here today for flank and lower abd pain for past 2-3 days. Denies any urinary burning or pain, vaginal bleeding or fever. Pt thinks "possible UTI". Pt has taken OTC Motrin and did resolve pain. Has hx of kidney stones 13 years ago x 1.   Will check urine dip today in office and if negative then will send for UC per protocol.  Pt verbalized understanding to plan of care.   Urine Dip: trace leuks, trace blood. Reviewed results with Samara Deist, CNM. Advised to have urine sent for UC. Pt given results and recommendations. Pt agreeable. Pt also wanting ot have referral for urology due to recurrent UTIs/ "kidney pain." Referral placed. Pt thankful for advice.   Judeth Cornfield, RN

## 2020-11-03 LAB — URINE CULTURE

## 2020-11-10 ENCOUNTER — Other Ambulatory Visit: Payer: Self-pay

## 2020-11-10 ENCOUNTER — Ambulatory Visit (INDEPENDENT_AMBULATORY_CARE_PROVIDER_SITE_OTHER): Payer: Medicaid Other | Admitting: *Deleted

## 2020-11-10 ENCOUNTER — Other Ambulatory Visit (HOSPITAL_COMMUNITY)
Admission: RE | Admit: 2020-11-10 | Discharge: 2020-11-10 | Disposition: A | Discharge status: Home / Self Care | Payer: Medicaid Other | Source: Ambulatory Visit | Attending: Family Medicine | Admitting: Family Medicine

## 2020-11-10 VITALS — BP 109/64 | HR 76 | Ht 62.0 in | Wt 167.5 lb

## 2020-11-10 DIAGNOSIS — N898 Other specified noninflammatory disorders of vagina: Secondary | ICD-10-CM | POA: Insufficient documentation

## 2020-11-10 NOTE — Progress Notes (Signed)
Here for nurse visit for self swab for bad vaginal odor , was treated for BV in September and requested retreatement because odor still there. Was told needs to come in and do another swab. Self swab obtained and will be notiified if needs treatment. Recommended it + for BV , will need to schedule visit with provider if reoccurs within a few months per protocol.  Aditri Louischarles,RN

## 2020-11-11 LAB — CERVICOVAGINAL ANCILLARY ONLY
Bacterial Vaginitis (gardnerella): POSITIVE — AB
Chlamydia: NEGATIVE
Comment: NEGATIVE
Comment: NEGATIVE
Comment: NEGATIVE
Comment: NORMAL
Neisseria Gonorrhea: NEGATIVE
Trichomonas: NEGATIVE

## 2020-11-11 NOTE — Progress Notes (Signed)
Attestation of Attending Supervision of clinical support staff: I agree with the care provided to this patient and was available for any consultation.  I have reviewed the RN's note and chart. I was available for consult and to see the patient if needed.   Njeri Vicente MD MPH Attending Physician Faculty Practice- [facility]  

## 2020-11-14 MED ORDER — METRONIDAZOLE 500 MG PO TABS
500.0000 mg | ORAL_TABLET | Freq: Two times a day (BID) | ORAL | 0 refills | Status: DC
Start: 2020-11-14 — End: 2021-01-03

## 2020-11-16 ENCOUNTER — Ambulatory Visit: Payer: Medicaid Other | Admitting: Urology

## 2020-11-21 ENCOUNTER — Ambulatory Visit: Payer: Medicaid Other | Admitting: Urology

## 2020-11-22 ENCOUNTER — Encounter: Payer: Self-pay | Admitting: Urology

## 2021-01-03 ENCOUNTER — Other Ambulatory Visit (HOSPITAL_COMMUNITY)
Admission: RE | Admit: 2021-01-03 | Discharge: 2021-01-03 | Disposition: A | Discharge status: Home / Self Care | Payer: Medicaid Other | Source: Ambulatory Visit | Attending: Family Medicine | Admitting: Family Medicine

## 2021-01-03 ENCOUNTER — Other Ambulatory Visit: Payer: Self-pay

## 2021-01-03 ENCOUNTER — Ambulatory Visit (INDEPENDENT_AMBULATORY_CARE_PROVIDER_SITE_OTHER): Payer: Medicaid Other

## 2021-01-03 VITALS — BP 115/75 | HR 67 | Wt 164.2 lb

## 2021-01-03 DIAGNOSIS — R8290 Unspecified abnormal findings in urine: Secondary | ICD-10-CM | POA: Diagnosis not present

## 2021-01-03 DIAGNOSIS — N898 Other specified noninflammatory disorders of vagina: Secondary | ICD-10-CM | POA: Diagnosis present

## 2021-01-03 LAB — POCT URINALYSIS DIP (DEVICE)
Bilirubin Urine: NEGATIVE
Glucose, UA: NEGATIVE mg/dL
Ketones, ur: NEGATIVE mg/dL
Leukocytes,Ua: NEGATIVE
Nitrite: NEGATIVE
Protein, ur: NEGATIVE mg/dL
Specific Gravity, Urine: 1.015 (ref 1.005–1.030)
Urobilinogen, UA: 0.2 mg/dL (ref 0.0–1.0)
pH: 5.5 (ref 5.0–8.0)

## 2021-01-03 NOTE — Progress Notes (Signed)
Chart reviewed for nurse visit. Agree with plan of care.   Chee Dimon N, PA-C 01/03/2021 10:21 AM   

## 2021-01-03 NOTE — Progress Notes (Signed)
Pt here today for self swab for vaginal odor that is fishy and has recent had unprotected sex and would like STD screening.  Pt requests that I also test her urine for possible UTI.  UA results trace of blood.  Pt informs me that she is having some bleeding today.  I informed pt that it is possibly why it resulted that she has blood in her urine however I will send for culture to make sure.  Pt agreed.  Pt reports that she has been having irregular bleeding since she has had a her tubal.  I encouraged pt to schedule an appt with a provider so that she can discuss options.  Pt explained how to obtain self swab and that we will call with abnormal results.  Pt requests that if she has to have treatment for BV if we could please go ahead and prescribe her Diflucan.  I advised the pt that we could do that.  Pt verbalized understanding to all that was discussed with no further questions or concerns.   Leonette Nutting  01/03/21

## 2021-01-04 LAB — CERVICOVAGINAL ANCILLARY ONLY
Bacterial Vaginitis (gardnerella): POSITIVE — AB
Candida Glabrata: NEGATIVE
Candida Vaginitis: NEGATIVE
Chlamydia: NEGATIVE
Comment: NEGATIVE
Comment: NEGATIVE
Comment: NEGATIVE
Comment: NEGATIVE
Comment: NEGATIVE
Comment: NORMAL
Neisseria Gonorrhea: NEGATIVE
Trichomonas: NEGATIVE

## 2021-01-05 ENCOUNTER — Telehealth: Payer: Self-pay | Admitting: General Practice

## 2021-01-05 DIAGNOSIS — B9689 Other specified bacterial agents as the cause of diseases classified elsewhere: Secondary | ICD-10-CM

## 2021-01-05 DIAGNOSIS — B379 Candidiasis, unspecified: Secondary | ICD-10-CM

## 2021-01-05 MED ORDER — METRONIDAZOLE 500 MG PO TABS
500.0000 mg | ORAL_TABLET | Freq: Two times a day (BID) | ORAL | 0 refills | Status: DC
Start: 2021-01-05 — End: 2021-03-17

## 2021-01-05 MED ORDER — FLUCONAZOLE 150 MG PO TABS
150.0000 mg | ORAL_TABLET | Freq: Once | ORAL | 0 refills | Status: AC
Start: 2021-01-05 — End: 2021-01-05

## 2021-01-05 NOTE — Telephone Encounter (Addendum)
-----   Message from Marny Lowenstein, PA-C sent at 01/05/2021 10:21 AM EST ----- Has Bv. Please treat with Flagyl. Thank you!   Raynelle Fanning

## 2021-01-05 NOTE — Telephone Encounter (Signed)
Flagyl ordered and sent to pharmacy. Called and informed patient of results and med at pharmacy. Patient verbalized understanding & asked for diflucan Rx due to antibiotics causing a yeast infection. Rx sent in for diflucan & patient informed. Patient had no other questions.

## 2021-01-07 LAB — URINE CULTURE

## 2021-01-21 NOTE — Progress Notes (Signed)
Chart reviewed for nurse visit. Agree with plan of care.  ° °Nathaniel Wakeley Lorraine, CNM °01/21/2021 3:25 AM  ° °

## 2021-03-14 ENCOUNTER — Ambulatory Visit: Payer: Medicaid Other

## 2021-03-17 ENCOUNTER — Other Ambulatory Visit (HOSPITAL_COMMUNITY)
Admission: RE | Admit: 2021-03-17 | Discharge: 2021-03-17 | Disposition: A | Discharge status: Home / Self Care | Payer: Medicaid Other | Source: Ambulatory Visit | Attending: Family Medicine | Admitting: Family Medicine

## 2021-03-17 ENCOUNTER — Other Ambulatory Visit: Payer: Self-pay

## 2021-03-17 ENCOUNTER — Ambulatory Visit (INDEPENDENT_AMBULATORY_CARE_PROVIDER_SITE_OTHER): Payer: Medicaid Other

## 2021-03-17 VITALS — BP 109/64 | HR 82 | Wt 168.6 lb

## 2021-03-17 DIAGNOSIS — N898 Other specified noninflammatory disorders of vagina: Secondary | ICD-10-CM | POA: Diagnosis not present

## 2021-03-17 NOTE — Progress Notes (Signed)
Pt here today for self swab with c/o vaginal discharge with no odor.  Pt states that she is having irritation as well.   Pt also requests STD screening.  Pt explained how to obtain self swab and that we will call with abnormal results.  Pt verbalized understanding with no further questions.  ? Addison Naegeli, RN  ?03/17/21 ?

## 2021-03-20 ENCOUNTER — Other Ambulatory Visit: Payer: Self-pay | Admitting: Family Medicine

## 2021-03-20 LAB — CERVICOVAGINAL ANCILLARY ONLY
Bacterial Vaginitis (gardnerella): POSITIVE — AB
Candida Glabrata: NEGATIVE
Candida Vaginitis: POSITIVE — AB
Chlamydia: NEGATIVE
Comment: NEGATIVE
Comment: NEGATIVE
Comment: NEGATIVE
Comment: NEGATIVE
Comment: NEGATIVE
Comment: NORMAL
Neisseria Gonorrhea: NEGATIVE
Trichomonas: NEGATIVE

## 2021-03-20 MED ORDER — FLUCONAZOLE 150 MG PO TABS: 150.0000 mg | ORAL_TABLET | Freq: Once | ORAL | 0 refills | Status: AC

## 2021-03-20 MED ORDER — METRONIDAZOLE 500 MG PO TABS: 500.0000 mg | ORAL_TABLET | Freq: Two times a day (BID) | ORAL | 0 refills | Status: AC

## 2021-04-14 ENCOUNTER — Ambulatory Visit: Payer: Medicaid Other | Admitting: Family Medicine

## 2021-05-19 ENCOUNTER — Other Ambulatory Visit (HOSPITAL_COMMUNITY)
Admission: RE | Admit: 2021-05-19 | Discharge: 2021-05-19 | Disposition: A | Discharge status: Home / Self Care | Payer: Medicaid Other | Source: Ambulatory Visit | Attending: Family Medicine | Admitting: Family Medicine

## 2021-05-19 ENCOUNTER — Ambulatory Visit (INDEPENDENT_AMBULATORY_CARE_PROVIDER_SITE_OTHER): Payer: Medicaid Other | Admitting: Family Medicine

## 2021-05-19 ENCOUNTER — Encounter: Payer: Self-pay | Admitting: Family Medicine

## 2021-05-19 VITALS — BP 126/87 | HR 86 | Wt 163.0 lb

## 2021-05-19 DIAGNOSIS — Z9851 Tubal ligation status: Secondary | ICD-10-CM

## 2021-05-19 DIAGNOSIS — N938 Other specified abnormal uterine and vaginal bleeding: Secondary | ICD-10-CM | POA: Diagnosis not present

## 2021-05-19 DIAGNOSIS — N898 Other specified noninflammatory disorders of vagina: Secondary | ICD-10-CM | POA: Insufficient documentation

## 2021-05-19 DIAGNOSIS — Z113 Encounter for screening for infections with a predominantly sexual mode of transmission: Secondary | ICD-10-CM

## 2021-05-19 NOTE — Assessment & Plan Note (Signed)
Unclear etiology. Discussed that studies do not show definite association with tubal ligation and her symptoms. Low likelihood for Filshie dislodgement given pain is cyclical and not constant. Recommended we do routine workup with CBC, TSH, pelvic US and see if there are other possible etiologies such as fibroids, she is amenable with this plan. Treatment pending those results.  ?

## 2021-05-19 NOTE — Assessment & Plan Note (Signed)
Swabs obtained, new sexual partner. Also accepts STI screening.  ?

## 2021-05-19 NOTE — Assessment & Plan Note (Signed)
Discussed with patient that method is usually determined by safety and feasability at time of surgery but Filshie's have been shown to be equally effective compared to other methods.  ?

## 2021-05-19 NOTE — Progress Notes (Signed)
? ?GYNECOLOGY OFFICE VISIT NOTE ? ?History:  ? Caitlin Terrell is a 36 y.o. [id number] here today for abdominal pain. ? ?Patient had postpartum tubal ligation on 02/16/2019 with myself and Dr. Debroah Loop with Filshie clips ? ?Today reports she has had heavier more irregular periods since getting her tubal ?Still has same frequency and regularity of her cycle but now it lasts longer and is more painful and crampy ?Used to last 3 days, now lasts 7 ?Does have some clots ?She is wondering if her tubal is what caused this ? ?Also wondering about the method of her tubal and if it is fully effective ?Wondering why she did not have different method used ? ?Also having some vaginal discharge, would like to do a self swab ?New sexual partner, would also like STI testing ? ?Health Maintenance Due  ?Topic Date Due  ? COVID-19 Vaccine (1) Never done  ? ? ?Past Medical History:  ?Diagnosis Date  ? Anxiety   ? Asthma   ? Environmental allergies   ? Gestational diabetes   ? Mental disorder   ? Bipolar  ? ? ?Past Surgical History:  ?Procedure Laterality Date  ? COLON SURGERY    ? hole in lg intestine @ birth  ? COLONOSCOPY    ? KNEE SURGERY    ? TONSILLECTOMY    ? TUBAL LIGATION N/A 02/16/2019  ? Procedure: POST PARTUM TUBAL LIGATION;  Surgeon: Adam Phenix, MD;  Location: [facility];  Service: Gynecology;  Laterality: N/A;  ? ? ?The following portions of the patient's history were reviewed and updated as appropriate: allergies, current medications, past family history, past medical history, past social history, past surgical history and problem list.  ? ?Health Maintenance:   ?Last pap: ?Lab Results  ?Component Value Date  ? DIAGPAP  08/27/2018  ?  NEGATIVE FOR INTRAEPITHELIAL LESIONS OR MALIGNANCY.  ? DIAGPAP TRICHOMONAS VAGINALIS PRESENT. 08/27/2018  ? HPV NOT Detected 08/27/2018  ? ?Not due until 2025 ? ?Last mammogram:  ?N/a  ? ? ?Review of Systems:  ?Pertinent items noted in HPI and remainder of comprehensive ROS otherwise  negative. ? ?Physical Exam:  ?BP 126/87   Pulse 86   Wt 163 lb (73.9 kg)   LMP 05/16/2021   BMI 29.81 kg/m?  ?CONSTITUTIONAL: Well-developed, well-nourished female in no acute distress.  ?HEENT:  Normocephalic, atraumatic. External right and left ear normal. No scleral icterus.  ?NECK: Normal range of motion, supple, no masses noted on observation ?SKIN: No rash noted. Not diaphoretic. No erythema. No pallor. ?MUSCULOSKELETAL: Normal range of motion. No edema noted. ?NEUROLOGIC: Alert and oriented to person, place, and time. Normal muscle tone coordination.  ?PSYCHIATRIC: Normal mood and affect. Normal behavior. Normal judgment and thought content. ?RESPIRATORY: Effort normal, no problems with respiration noted ? ? ?Labs and Imaging ?No results found for this or any previous visit (from the past 168 hour(s)). ?No results found.    ?Assessment and Plan:  ? ?Problem List Items Addressed This Visit   ? ?  ? Genitourinary  ? DUB (dysfunctional uterine bleeding) - Primary  ?  Unclear etiology. Discussed that studies do not show definite association with tubal ligation and her symptoms. Low likelihood for Filshie dislodgement given pain is cyclical and not constant. Recommended we do routine workup with CBC, TSH, pelvic US and see if there are other possible etiologies such as fibroids, she is amenable with this plan. Treatment pending those results.  ? ?  ?  ? Relevant Orders  ?  CBC  ? TSH + free T4  ? US PELVIC COMPLETE WITH TRANSVAGINAL  ?  ? Other  ? Vaginal discharge  ?  Swabs obtained, new sexual partner. Also accepts STI screening.  ? ?  ?  ? Relevant Orders  ? Cervicovaginal ancillary only( [place])  ? History of bilateral tubal ligation  ?  Discussed with patient that method is usually determined by safety and feasability at time of surgery but Filshie's have been shown to be equally effective compared to other methods.  ? ?  ?  ? ?Other Visit Diagnoses   ? ? Screening examination for sexually transmitted  disease      ? Relevant Orders  ? Cervicovaginal ancillary only( [place])  ? HIV antibody (with reflex)  ? RPR  ? ?  ? ? ?Routine preventative health maintenance measures emphasized. ?Please refer to After Visit Summary for other counseling recommendations.  ? ?Return if symptoms worsen or fail to improve.   ? ?Total face-to-face time with patient: 20 minutes.  Over 50% of encounter was spent on counseling and coordination of care. ? ? ?Venora Maples, MD/MPH ?Attending Family Medicine Physician, Faculty Practice ?Center for [employer], [facility] ? ?

## 2021-05-20 LAB — HIV ANTIBODY (ROUTINE TESTING W REFLEX): HIV Screen 4th Generation wRfx: NONREACTIVE

## 2021-05-20 LAB — TSH+FREE T4
Free T4: 1.18 ng/dL (ref 0.82–1.77)
TSH: 2.21 u[IU]/mL (ref 0.450–4.500)

## 2021-05-20 LAB — CBC
Hematocrit: 43.8 % (ref 34.0–46.6)
Hemoglobin: 15.6 g/dL (ref 11.1–15.9)
MCH: 32.4 pg (ref 26.6–33.0)
MCHC: 35.6 g/dL (ref 31.5–35.7)
MCV: 91 fL (ref 79–97)
Platelets: 275 10*3/uL (ref 150–450)
RBC: 4.81 x10E6/uL (ref 3.77–5.28)
RDW: 13.3 % (ref 11.7–15.4)
WBC: 5.8 10*3/uL (ref 3.4–10.8)

## 2021-05-20 LAB — RPR: RPR Ser Ql: NONREACTIVE

## 2021-05-22 ENCOUNTER — Encounter: Payer: Self-pay | Admitting: Family Medicine

## 2021-05-22 LAB — CERVICOVAGINAL ANCILLARY ONLY
Bacterial Vaginitis (gardnerella): POSITIVE — AB
Candida Glabrata: NEGATIVE
Candida Vaginitis: NEGATIVE
Chlamydia: NEGATIVE
Comment: NEGATIVE
Comment: NEGATIVE
Comment: NEGATIVE
Comment: NEGATIVE
Comment: NEGATIVE
Comment: NORMAL
Neisseria Gonorrhea: NEGATIVE
Trichomonas: NEGATIVE

## 2021-05-22 MED ORDER — METRONIDAZOLE 500 MG PO TABS: 500.0000 mg | ORAL_TABLET | Freq: Two times a day (BID) | ORAL | 0 refills | Status: AC

## 2021-05-22 MED ORDER — FLUCONAZOLE 150 MG PO TABS
150.0000 mg | ORAL_TABLET | Freq: Every day | ORAL | 0 refills | Status: DC
Start: 2021-05-22 — End: 2021-08-07

## 2021-05-22 NOTE — Addendum Note (Signed)
Addended by: Merian Capron on: 05/22/2021 03:55 PM ? ? Modules accepted: Orders ? ?

## 2021-06-08 ENCOUNTER — Inpatient Hospital Stay: Admission: RE | Admit: 2021-06-08 | Payer: Medicaid Other | Source: Ambulatory Visit

## 2021-07-10 ENCOUNTER — Ambulatory Visit
Admission: RE | Admit: 2021-07-10 | Discharge: 2021-07-10 | Disposition: A | Discharge status: Home / Self Care | Payer: Medicaid Other | Source: Ambulatory Visit | Attending: Family Medicine | Admitting: Family Medicine

## 2021-07-10 DIAGNOSIS — N938 Other specified abnormal uterine and vaginal bleeding: Secondary | ICD-10-CM | POA: Diagnosis present

## 2021-07-13 NOTE — Progress Notes (Signed)
Please call patient and let her know that ultrasound shows a small 2 cm cyst. I am not sure what this is but I would like her to see one of my OBGYN partners to discuss this further and see what if any treatment needs to happen.

## 2021-07-17 ENCOUNTER — Encounter: Payer: Self-pay | Admitting: *Deleted

## 2021-07-17 ENCOUNTER — Telehealth: Payer: Self-pay | Admitting: *Deleted

## 2021-07-17 ENCOUNTER — Telehealth: Payer: Self-pay | Admitting: General Practice

## 2021-07-17 NOTE — Telephone Encounter (Signed)
Called patient regarding mychart message. Patient states she saw the message about having a 2cm cyst. Patient states she is unsure what said it is on but she has been having left sided pain and had her period twice this past month. Told patient the cyst is on her right side but the pain can refer over. Discussed the period was likely unrelated but not impossible. Discussed her appt on the 26th would be changed to a different provider that day. Patient asked if her appt needed to be sooner and I told her no, the 26th would be okay. Patient verbalized understanding.

## 2021-07-17 NOTE — Telephone Encounter (Signed)
I called Caitlin Terrell to notify her Dr. Crissie Reese plan of care. I was unable to leave a message- I heard a message mailbox is full. Will send MyChart message. Nancy Fetter

## 2021-07-17 NOTE — Telephone Encounter (Signed)
-----   Message from Venora Maples, MD sent at 07/13/2021  4:45 PM EDT ----- Please call patient and let her know that ultrasound shows a small 2 cm cyst. I am not sure what this is but I would like her to see one of my OBGYN partners to discuss this further and see what if any treatment needs to happen.

## 2021-08-07 NOTE — Progress Notes (Deleted)
GYNECOLOGY OFFICE VISIT NOTE  History:   Caitlin Terrell is a 36 y.o. [id number] here today for ***.     She denies any abnormal vaginal discharge, bleeding, pelvic pain or other concerns.     Past Medical History:  Diagnosis Date   Anxiety    Asthma    Environmental allergies    Gestational diabetes    Mental disorder    Bipolar    Past Surgical History:  Procedure Laterality Date   COLON SURGERY     hole in lg intestine @ birth   COLONOSCOPY     KNEE SURGERY     TONSILLECTOMY     TUBAL LIGATION N/A 02/16/2019   Procedure: POST PARTUM TUBAL LIGATION;  Surgeon: Adam Phenix, MD;  Location: [facility] LD [facility];  Service: Gynecology;  Laterality: N/A;    The following portions of the patient's history were reviewed and updated as appropriate: allergies, current medications, past family history, past medical history, past social history, past surgical history and problem list.   Health Maintenance:   Normal pap and negative HRHPV:   Diagnosis  Date Value Ref Range Status  08/27/2018   Final   NEGATIVE FOR INTRAEPITHELIAL LESIONS OR MALIGNANCY.  08/27/2018 TRICHOMONAS VAGINALIS PRESENT.  Final    Review of Systems:  Pertinent items noted in HPI and remainder of comprehensive ROS otherwise negative.  Physical Exam:  There were no vitals taken for this visit. CONSTITUTIONAL: Well-developed, well-nourished female in no acute distress.  HEENT:  Normocephalic, atraumatic. External right and left ear normal. No scleral icterus.  NECK: Normal range of motion, supple, no masses noted on observation SKIN: No rash noted. Not diaphoretic. No erythema. No pallor. MUSCULOSKELETAL: Normal range of motion. No edema noted. NEUROLOGIC: Alert and oriented to person, place, and time. Normal muscle tone coordination. No cranial nerve deficit noted. PSYCHIATRIC: Normal mood and affect. Normal behavior. Normal judgment and thought content.  CARDIOVASCULAR: Normal heart rate noted RESPIRATORY:  Effort and breath sounds normal, no problems with respiration noted ABDOMEN: No masses noted. No other overt distention noted.    PELVIC: {Blank single:[id number]::"Deferred","Normal appearing external genitalia; normal urethral meatus; normal appearing vaginal mucosa and cervix.  No abnormal discharge noted.  Normal uterine size, no other palpable masses, no uterine or adnexal tenderness. Performed in the presence of a chaperone"}  Labs and Imaging No results found for this or any previous visit (from the past 168 hour(s)). US PELVIC COMPLETE WITH TRANSVAGINAL  Result Date: 07/10/2021 CLINICAL DATA:  Patient with dysfunctional uterine bleeding. EXAM: TRANSABDOMINAL AND TRANSVAGINAL ULTRASOUND OF PELVIS TECHNIQUE: Both transabdominal and transvaginal ultrasound examinations of the pelvis were performed. Transabdominal technique was performed for global imaging of the pelvis including uterus, ovaries, adnexal regions, and pelvic cul-de-sac. It was necessary to proceed with endovaginal exam following the transabdominal exam to visualize the adnexal structures. COMPARISON:  None Available. FINDINGS: Uterus Measurements: 8.9 x 4.7 x 5.8 cm = volume: 125.6 mL. No fibroids or other mass visualized. Endometrium Thickness: 9 mm.  No focal abnormality visualized. Right ovary Measurements: 3.6 x 3.1 x 3.4 cm = volume: 20.1 mL. Within the right ovary there is a 2.7 x 2.0 x 2.5 cm complex cystic structure with internal septations and peripheral blood flow. Left ovary Measurements: 2.8 x 2.5 x 3.1 cm = volume: 11.5 mL. Normal appearance/no adnexal mass. Other findings No abnormal free fluid. IMPRESSION: There is a complex cystic mass within the right ovary with multiple thin internal septations, indeterminate in etiology. Given the  complex appearance of this mass, recommend surgical consultation. At a minimum, a follow-up pelvic ultrasound in 4-6 weeks is recommended. Endometrium measures 9 mm. If bleeding remains  unresponsive to hormonal or medical therapy, sonohysterogram should be considered for focal lesion work-up. (Ref: Radiological Reasoning: Algorithmic Workup of Abnormal Vaginal Bleeding with Endovaginal Sonography and Sonohysterography. AJR 2008; [id number]:[id number]) These results will be called to the ordering clinician or representative by the Radiologist Assistant, and communication documented in the PACS or [employer]. Electronically Signed   By: Annia Belt M.D.   On: 07/10/2021 17:49    Assessment and Plan:   1. DUB (dysfunctional uterine bleeding) ***  2. Ovarian cyst, complex Would recheck in 6 weeks - if persistent can either monitor or have surgery for it at that time. Risks of surgery would be possible removal of cyst vs ovarian cystectomy.     Diagnoses and all orders for this visit:  DUB (dysfunctional uterine bleeding)  Ovarian cyst, complex Comments: Right    Routine preventative health maintenance measures emphasized. Please refer to After Visit Summary for other counseling recommendations.   No follow-ups on file.  Milas Hock, MD, FACOG Obstetrician & Gynecologist, [facility] for [facility], [facility]

## 2021-08-09 ENCOUNTER — Ambulatory Visit: Payer: Medicaid Other | Admitting: Obstetrics and Gynecology

## 2021-08-09 ENCOUNTER — Ambulatory Visit: Payer: Medicaid Other | Admitting: Family Medicine

## 2021-08-09 DIAGNOSIS — N938 Other specified abnormal uterine and vaginal bleeding: Secondary | ICD-10-CM

## 2021-08-09 DIAGNOSIS — N83299 Other ovarian cyst, unspecified side: Secondary | ICD-10-CM

## 2021-09-19 IMAGING — US US MFM OB DETAIL+14 WK
1 series · 12 of 28 positions shown · non-contrast
Comparison: none

[Series 1: us mfm ob detail+14 wk · 12 of 66 slices shown]
[im 3/66]
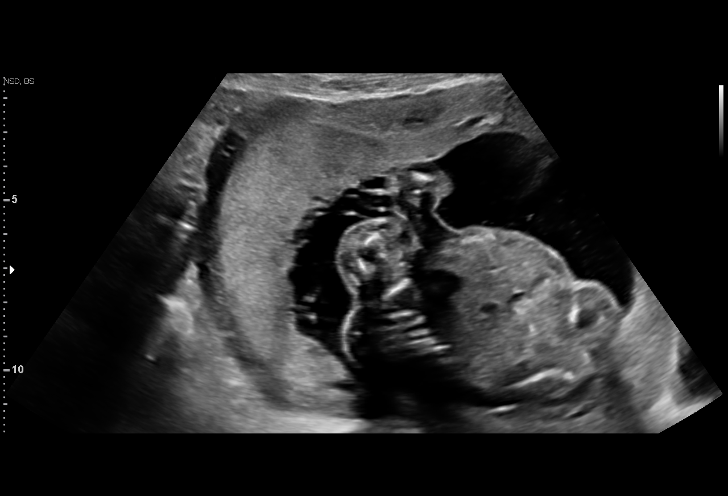
[im 8/66]
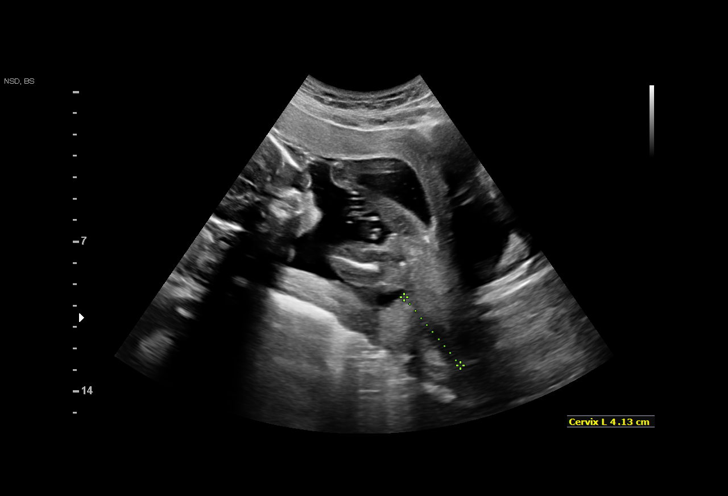
[im 13/66]
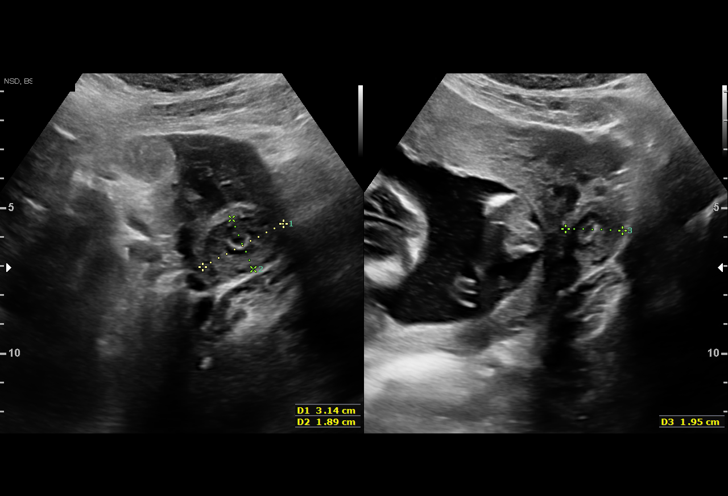
[im 20/66]
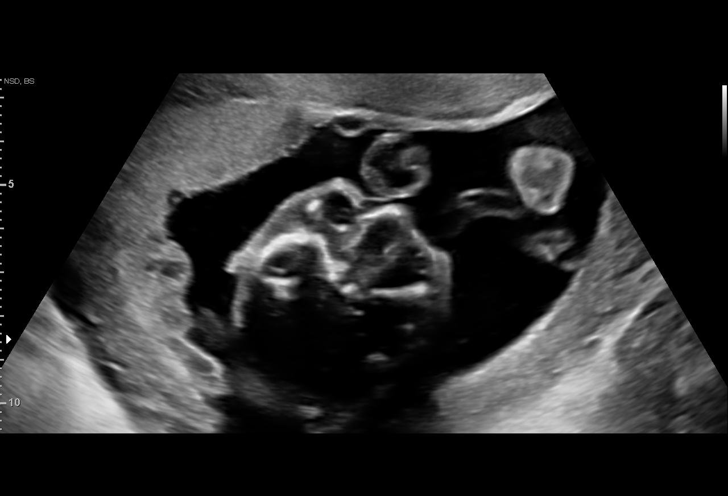
[im 25/66]
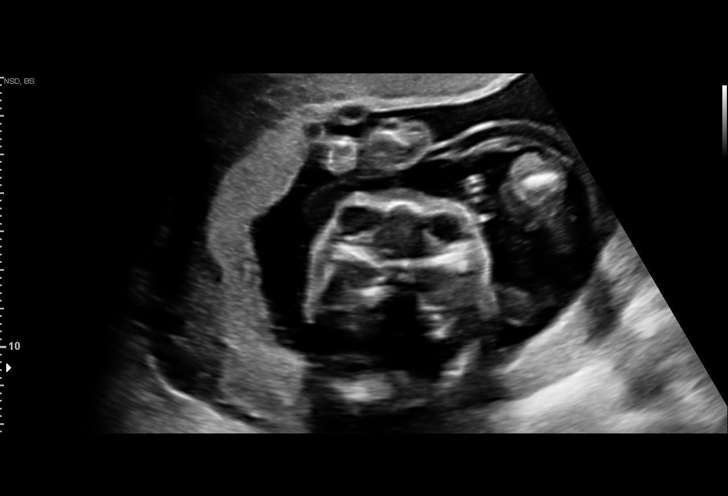
[im 29/66]
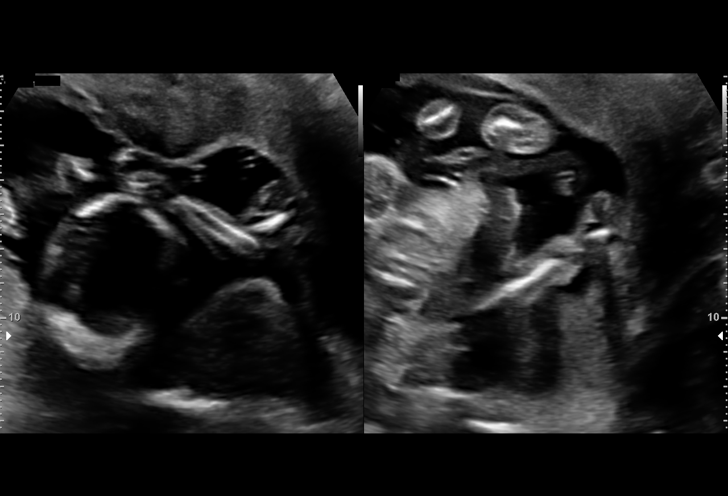
[im 37/66]
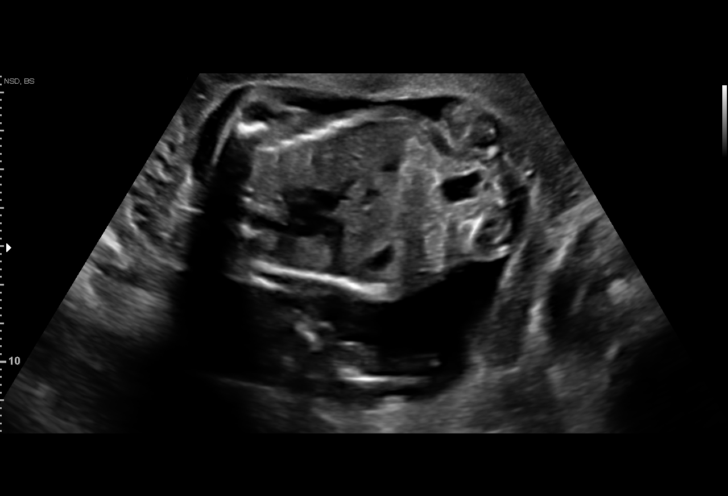
[im 41/66]
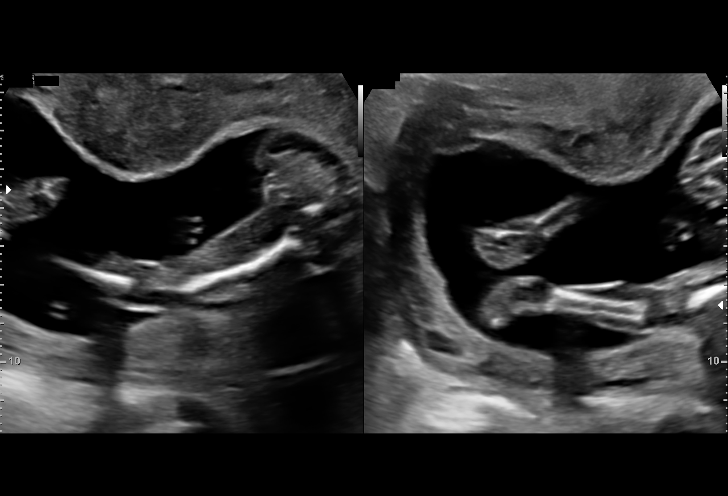
[im 46/66]
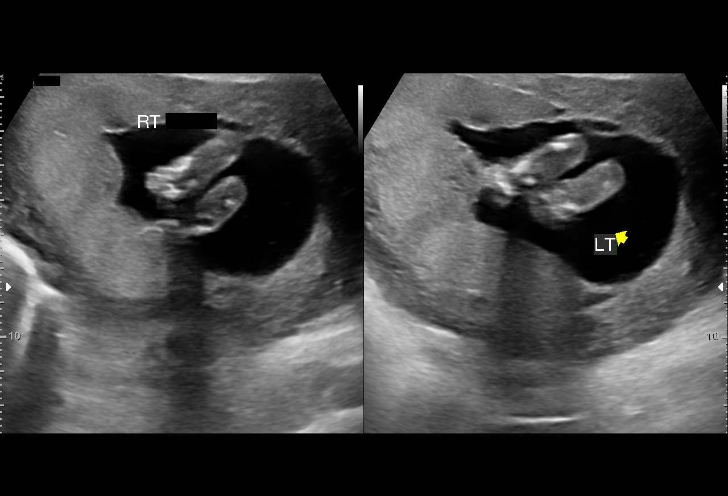
[im 53/66]
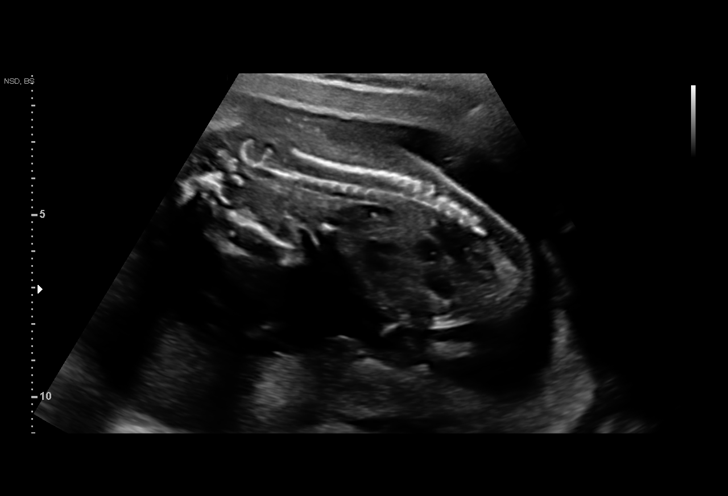
[im 58/66]
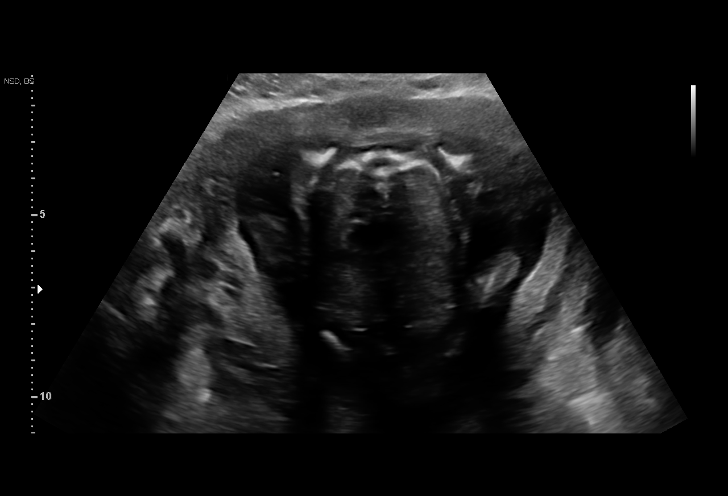
[im 63/66]
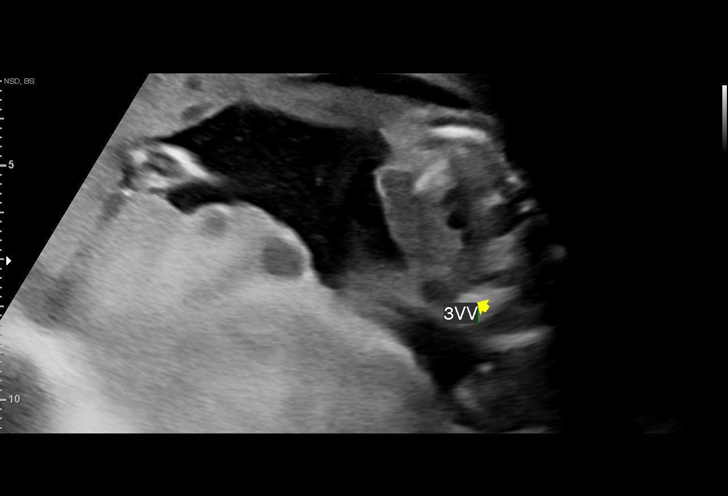

[12 of 28 positions shown; findings below may reference images not displayed]

[address]
 Ref. Address:     Faculty

 ----------------------------------------------------------------------

 ----------------------------------------------------------------------
Indications

  Cystic Fibrosis (CF) Carrier, second trimester
  Encounter for antenatal screening for
  malformations (LOW risk NIPS)
  19 weeks gestation of pregnancy
 ----------------------------------------------------------------------
Fetal Evaluation

 Num Of Fetuses:         1
 Fetal Heart Rate(bpm):  149
 Cardiac Activity:       Observed
 Presentation:           Breech
 Placenta:               Fundal
 P. Cord Insertion:      Visualized, central

 Amniotic Fluid
 AFI FV:      Within normal limits

                             Largest Pocket(cm)

Biometry

 BPD:      45.9  mm     G. Age:  19w 6d         63  %    CI:        73.28   %    70 - 86
                                                         FL/HC:      17.6   %    16.8 -
 HC:      170.4  mm     G. Age:  19w 5d         46  %    HC/AC:      1.07        1.09 -
 AC:      159.1  mm     G. Age:  21w 0d         87  %    FL/BPD:     65.4   %
 FL:         30  mm     G. Age:  19w 2d         31  %    FL/AC:      18.9   %    20 - 24
 HUM:      30.9  mm     G. Age:  20w 2d         70  %
 CER:      19.7  mm     G. Age:  18w 6d         34  %
 NFT:       3.4  mm

 LV:        5.8  mm
 CM:        3.8  mm
 Est. FW:     335  gm    0 lb 12 oz      78  %
OB History

 Gravidity:    2         Term:   1        Prem:   0        SAB:   0
 TOP:          0       Ectopic:  0        Living: 1
Gestational Age

 LMP:           19w 4d        Date:  05/31/18                 EDD:   03/07/19
 U/S Today:     20w 0d                                        EDD:   03/04/19
 Best:          19w 4d     Det. By:  LMP  (05/31/18)          EDD:   03/07/19
Anatomy

 Cranium:               Appears normal         LVOT:                   Not well visualized
 Cavum:                 Appears normal         Aortic Arch:            Not well visualized
 Ventricles:            Appears normal         Ductal Arch:            Not well visualized
 Choroid Plexus:        Appears normal         Diaphragm:              Appears normal
 Cerebellum:            Appears normal         Stomach:                Appears normal, left
                                                                       sided
 Posterior Fossa:       Appears normal         Abdomen:                Appears normal
 Nuchal Fold:           Appears normal         Abdominal Wall:         Appears nml (cord
                                                                       insert, abd wall)
 Face:                  Orbits appear          Cord Vessels:           Appears normal (3
                        normal                                         vessel cord)
 Lips:                  Not well visualized    Kidneys:                Appear normal
 Palate:                Not well visualized    Bladder:                Appears normal
 Thoracic:              Appears normal         Spine:                  Limited views
                                                                       appear normal
 Heart:                 Not well visualized    Upper Extremities:      Appears normal
 RVOT:                  Not well visualized    Lower Extremities:      Appears normal

 Other:  Fetus appears to be female. Open hands visualized. 5th digit
         visualized. Heels visualized. Nasal bone visualized. Technically
         difficult due to fetal position.
Cervix Uterus Adnexa

 Cervix
 Length:           4.13  cm.
 Normal appearance by transabdominal scan.

 Left Ovary
 Not visualized.

 Right Ovary
 Within normal limits.

 Adnexa
 No abnormality visualized.
Comments

 This patient was seen for a detailed fetal anatomy scan as
 she was identified as a carrier for cystic fibrosis.  The father
 the baby has not been screened to determine if he is also a
 carrier for cystic fibrosis. She denies any other significant
 past medical history and denies any problems in her current
 pregnancy.
 She had a cell free DNA test earlier in her pregnancy which
 indicated a low risk for trisomy 21, 18, and 13. A female fetus
 is predicted.
 She was informed that the fetal growth and amniotic fluid
 level were appropriate for her gestational age.
 The views of the fetal anatomy were limited today due to the
 fetal position.
 The patient was informed that anomalies may be missed due
 to technical limitations. If the fetus is in a suboptimal position
 or maternal habitus is increased, visualization of the fetus in
 the maternal uterus may be impaired.
 The implications and management of being a carrier for cystic
 fibrosis in pregnancy was discussed in detail with the patient
 today.  She was advised to have the father of the baby
 screened to determine if he is also a carrier for cystic fibrosis.
 As cystic fibrosis is an autosomal recessive condition, should
 both she and the father the baby be carriers, there would be
 a 25% chance that her baby will be affected by cystic fibrosis.
 She was offered and declined an amniocentesis today for
 definitive diagnosis of cystic fibrosis.
 A follow-up exam was scheduled in 4 weeks to obtain better
 views of the fetal anatomy.

## 2021-09-22 ENCOUNTER — Other Ambulatory Visit (HOSPITAL_COMMUNITY)
Admission: RE | Admit: 2021-09-22 | Discharge: 2021-09-22 | Disposition: A | Discharge status: Home / Self Care | Payer: Medicaid Other | Source: Ambulatory Visit | Attending: Family Medicine | Admitting: Family Medicine

## 2021-09-22 ENCOUNTER — Ambulatory Visit (INDEPENDENT_AMBULATORY_CARE_PROVIDER_SITE_OTHER): Payer: Medicaid Other | Admitting: Advanced Practice Midwife

## 2021-09-22 ENCOUNTER — Encounter: Payer: Self-pay | Admitting: Advanced Practice Midwife

## 2021-09-22 VITALS — BP 110/71 | HR 74 | Wt 167.0 lb

## 2021-09-22 DIAGNOSIS — N898 Other specified noninflammatory disorders of vagina: Secondary | ICD-10-CM | POA: Diagnosis present

## 2021-09-22 DIAGNOSIS — Z113 Encounter for screening for infections with a predominantly sexual mode of transmission: Secondary | ICD-10-CM | POA: Diagnosis present
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
## 2021-09-25 ENCOUNTER — Ambulatory Visit: Payer: Medicaid Other | Admitting: Obstetrics and Gynecology

## 2021-09-25 LAB — CERVICOVAGINAL ANCILLARY ONLY
Bacterial Vaginitis (gardnerella): NEGATIVE
Candida Glabrata: NEGATIVE
Candida Vaginitis: NEGATIVE
Chlamydia: NEGATIVE
Comment: NEGATIVE
Comment: NEGATIVE
Comment: NEGATIVE
Comment: NEGATIVE
Comment: NEGATIVE
Comment: NORMAL
Neisseria Gonorrhea: NEGATIVE
Trichomonas: NEGATIVE

## 2021-09-27 ENCOUNTER — Encounter: Payer: Self-pay | Admitting: Advanced Practice Midwife

## 2021-09-27 NOTE — Progress Notes (Signed)
Left before being seen by provider. Reviewed chart. Needs to be seen by Gyn MD for adnexal mass. Will reschedule appropriately.

## 2021-10-18 ENCOUNTER — Other Ambulatory Visit: Payer: Self-pay | Admitting: Obstetrics and Gynecology

## 2021-10-18 DIAGNOSIS — N938 Other specified abnormal uterine and vaginal bleeding: Secondary | ICD-10-CM

## 2021-10-18 DIAGNOSIS — N83209 Unspecified ovarian cyst, unspecified side: Secondary | ICD-10-CM

## 2021-10-18 NOTE — Progress Notes (Signed)
r 

## 2021-11-01 ENCOUNTER — Other Ambulatory Visit: Payer: Medicaid Other

## 2021-11-07 ENCOUNTER — Ambulatory Visit
Admission: RE | Admit: 2021-11-07 | Discharge: 2021-11-07 | Disposition: A | Discharge status: Home / Self Care | Payer: Medicaid Other | Source: Ambulatory Visit | Attending: Obstetrics and Gynecology | Admitting: Obstetrics and Gynecology

## 2021-11-07 DIAGNOSIS — N938 Other specified abnormal uterine and vaginal bleeding: Secondary | ICD-10-CM | POA: Insufficient documentation

## 2021-11-07 DIAGNOSIS — N83209 Unspecified ovarian cyst, unspecified side: Secondary | ICD-10-CM | POA: Insufficient documentation

## 2021-11-14 IMAGING — US US MFM OB FOLLOW-UP
1 series · 13 of 28 positions shown · non-contrast
Comparison: none

[Series 1: us mfm ob follow-up · 49 acquisitions, 13 frames shown]
[im 2/49]
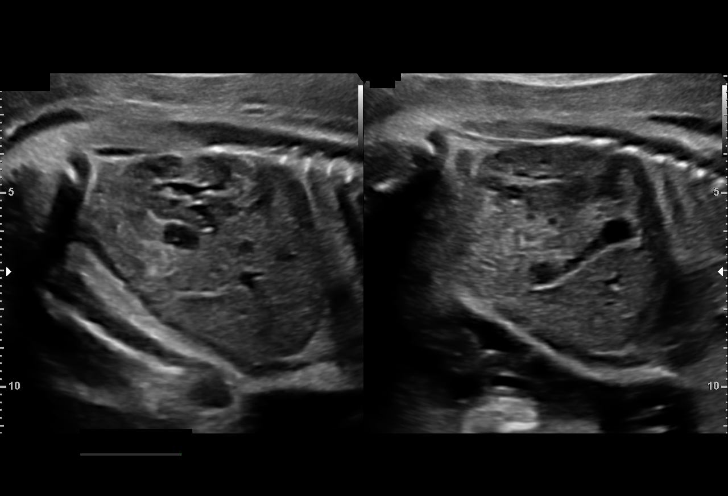
[im 6/49]
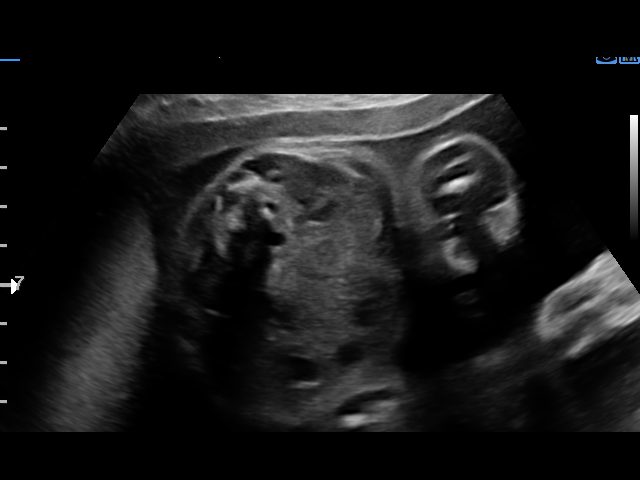
[im 9/49]
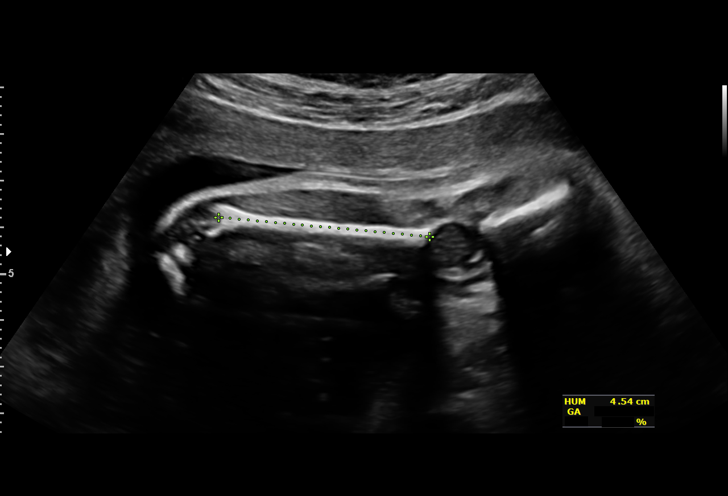
[im 13/49]
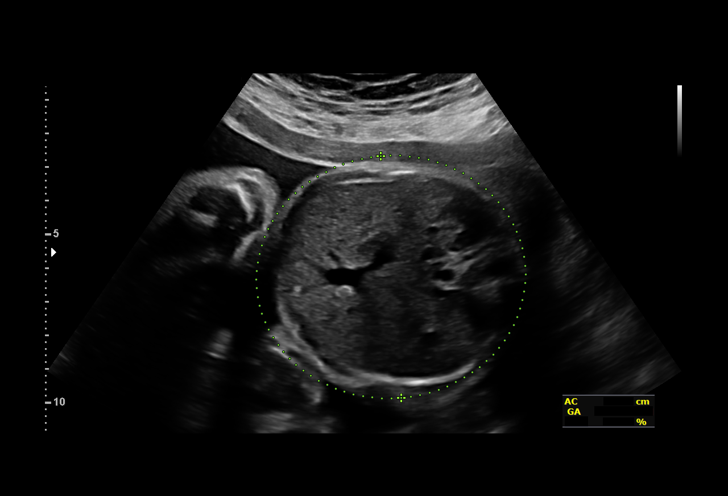
[im 17/49]
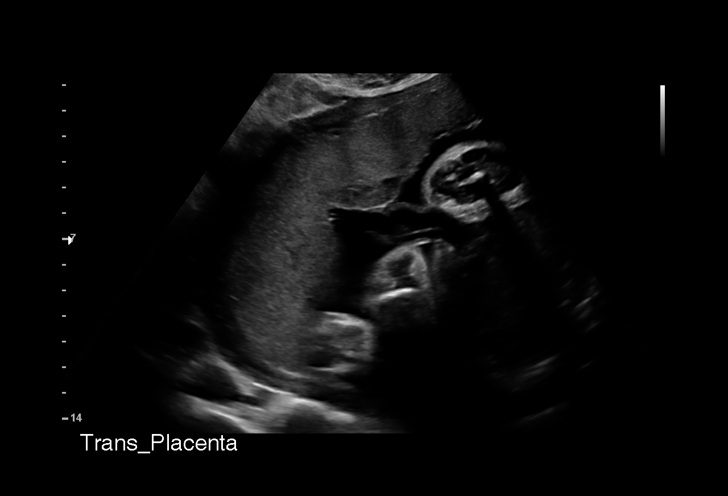
[im 20/49]
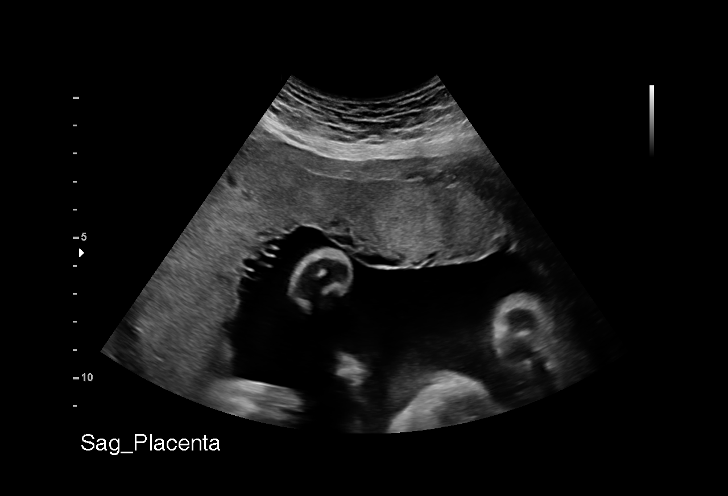
[im 25/49]
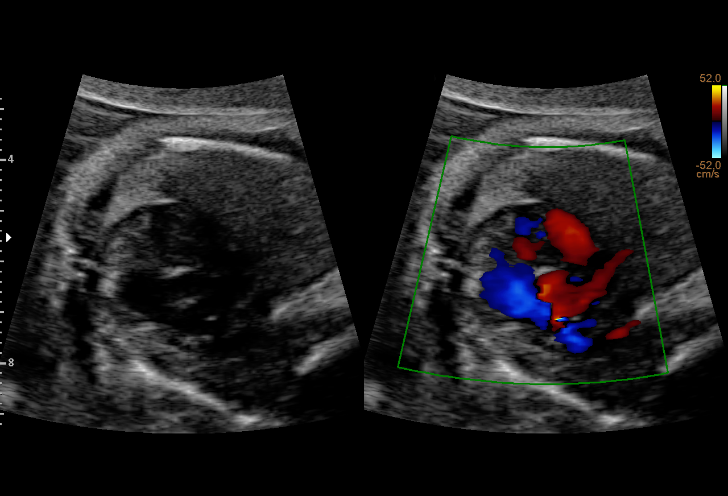
[im 29/49]
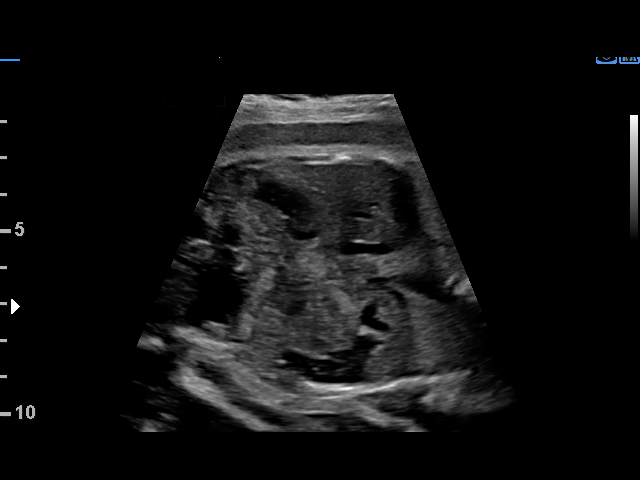
[im 33/49]
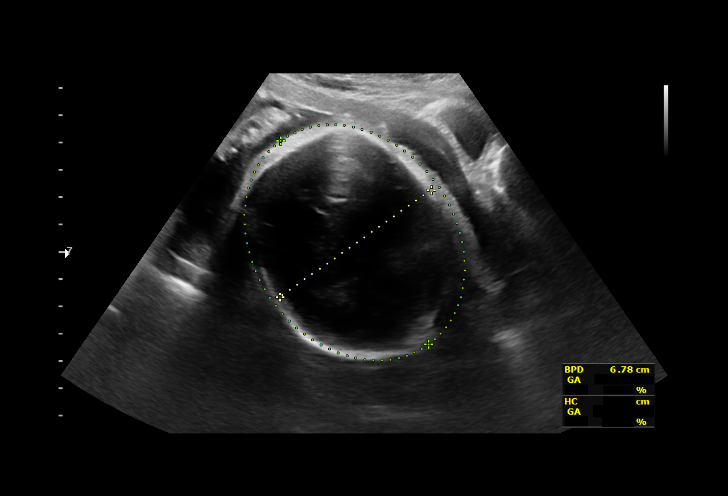
[im 36/49]
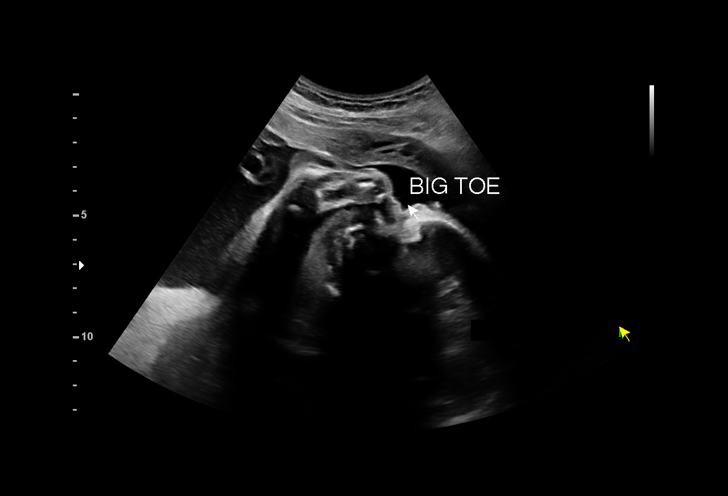
[im 40/49]
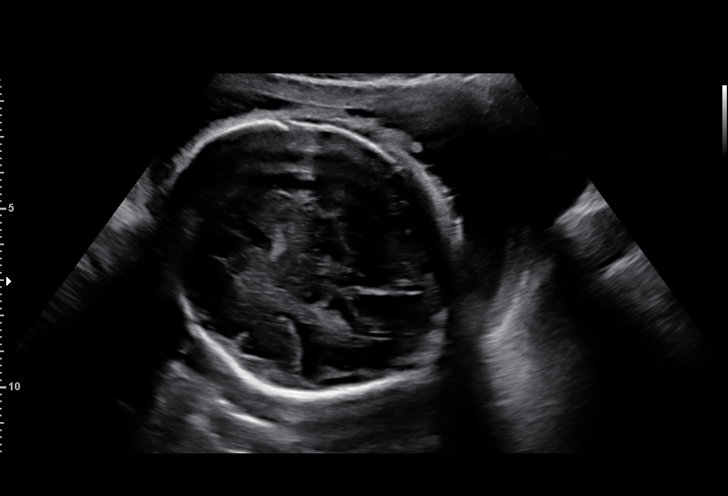
[im 43/49]
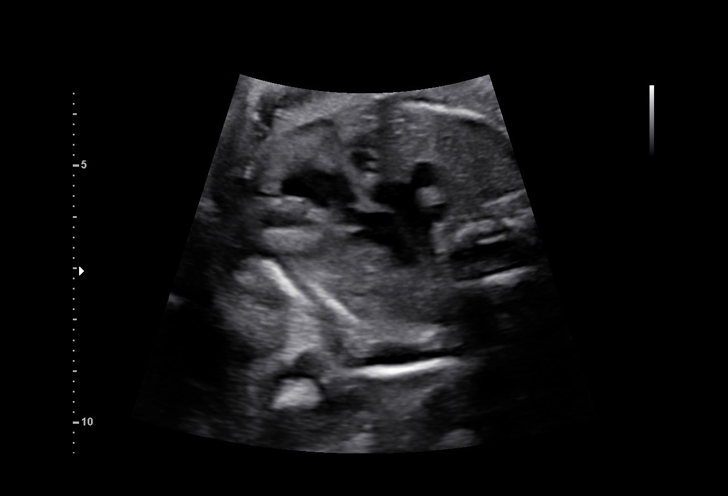
[im 47/49]
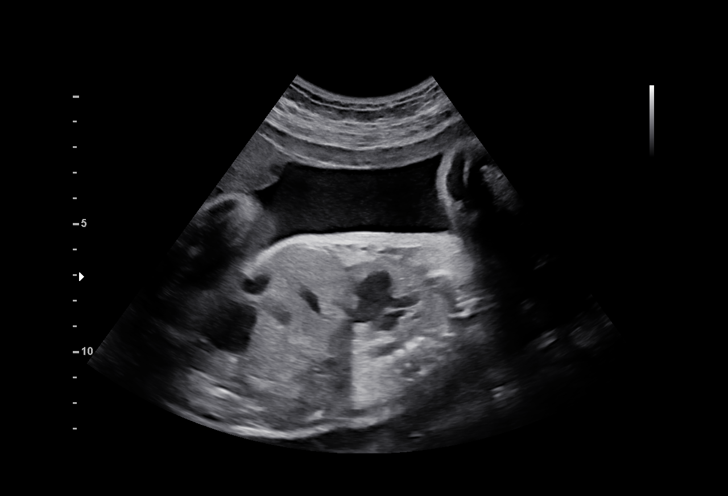

[13 of 28 positions shown; findings below may reference images not displayed]

[address]
 Ref. Address:     Faculty

                                                       [place]
 ----------------------------------------------------------------------

 ----------------------------------------------------------------------
Indications

  Fetal abnormality - other known or suspected
  Cystic Fibrosis (CF) Carrier, second trimester
  Encounter for antenatal screening for
  malformations (LOW risk NIPS)
  27 weeks gestation of pregnancy
 ----------------------------------------------------------------------
Fetal Evaluation

 Num Of Fetuses:         1
 Fetal Heart Rate(bpm):  148
 Cardiac Activity:       Observed
 Presentation:           Cephalic
 Placenta:               Fundal
 P. Cord Insertion:      Visualized, central

 Amniotic Fluid
 AFI FV:      Within normal limits

                             Largest Pocket(cm)

Biometry

 BPD:      68.3  mm     G. Age:  27w 3d         35  %    CI:        69.64   %    70 - 86
                                                         FL/HC:      20.5   %    18.8 -
 HC:      261.2  mm     G. Age:  28w 3d         47  %    HC/AC:      1.09        1.05 -
 AC:       239   mm     G. Age:  28w 1d         62  %    FL/BPD:     78.3   %    71 - 87
 FL:       53.5  mm     G. Age:  28w 3d         59  %    FL/AC:      22.4   %    20 - 24
 HUM:      46.2  mm     G. Age:  27w 2d         39  %
 Est. FW:    3355  gm    2 lb 10 oz      64  %
OB History

 Gravidity:    2         Term:   1        Prem:   0        SAB:   0
 TOP:          0       Ectopic:  0        Living: 1
Gestational Age

 LMP:           27w 4d        Date:  05/31/18                 EDD:   03/07/19
 U/S Today:     28w 1d                                        EDD:   03/03/19
 Best:          27w 4d     Det. By:  LMP  (05/31/18)          EDD:   03/07/19
Anatomy

 Cranium:               Appears normal         LVOT:                   Previously seen
 Cavum:                 Previously seen        Aortic Arch:            Previously seen
 Ventricles:            Appears normal         Ductal Arch:            Previously seen
 Choroid Plexus:        Previously seen        Diaphragm:              Previously seen
 Cerebellum:            Previously seen        Stomach:                Appears normal, left
                                                                       sided
 Posterior Fossa:       Previously seen        Abdomen:                Previously seen
 Nuchal Fold:           Previously seen        Abdominal Wall:         Previously seen
 Face:                  Orbits previously      Cord Vessels:           Previously seen
                        seen
 Lips:                  Appears normal         Kidneys:                Appear normal
 Palate:                Not well visualized    Bladder:                Appears normal
 Thoracic:              Appears normal         Spine:                  Appears normal
 Heart:                 Appears normal         Upper Extremities:      Previously seen
                        (4CH, axis, and
                        situs)
 RVOT:                  Appears normal         Lower Extremities:      Previously seen

 Other:  Female gender previously seen. Open hands, 5th digit, Heels,  and
         Nasal bone previously visualized. Technically difficult due to fetal
         position.
Cervix Uterus Adnexa

 Cervix
 Not visualized (advanced GA >88wks)

 Uterus
 No abnormality visualized.

 Left Ovary
 No adnexal mass visualized.

 Right Ovary
 No adnexal mass visualized.

 Cul De Sac
 No free fluid seen.

 Adnexa
 No abnormality visualized.
Comments

 This patient was seen for a follow up growth scan due to
 echogenic bowel that was noted during her prior ultrasound
 exams.  Her work-up for the echogenic bowel did not indicate
 an acute infection for toxoplasmosis, CMV, and parvovirus.
 She denies any problems since her last exam.
 She was informed that the fetal growth and amniotic fluid
 level appears appropriate for her gestational age.  The fetal
 bowel did not appear echogenic today.
 The association between echogenic bowel and cystic fibrosis
 was discussed with the patient today. She was advised that
 as she is a carrier for cystic fibrosis, the echogenic bowel
 may be a sonographic marker for cystic fibrosis.  She was
 offered and declined an amniocentesis today for definitive
 diagnosis of cystic fibrosis.
 A follow up exam was scheduled in 5 weeks.

## 2021-11-20 ENCOUNTER — Ambulatory Visit (INDEPENDENT_AMBULATORY_CARE_PROVIDER_SITE_OTHER): Payer: Medicaid Other

## 2021-11-20 ENCOUNTER — Ambulatory Visit: Payer: Medicaid Other | Admitting: Obstetrics and Gynecology

## 2021-11-20 ENCOUNTER — Other Ambulatory Visit (HOSPITAL_COMMUNITY)
Admission: RE | Admit: 2021-11-20 | Discharge: 2021-11-20 | Disposition: A | Discharge status: Home / Self Care | Payer: Medicaid Other | Source: Ambulatory Visit | Attending: Obstetrics & Gynecology | Admitting: Obstetrics & Gynecology

## 2021-11-20 VITALS — BP 124/84 | Ht 62.0 in | Wt 176.5 lb

## 2021-11-20 DIAGNOSIS — N898 Other specified noninflammatory disorders of vagina: Secondary | ICD-10-CM | POA: Diagnosis present

## 2021-11-20 NOTE — Progress Notes (Signed)
Pt reports recent new sex partner and has vaginal odor and some discharge when she inserts her finger inside her vagina.  Pt explained how to obtain self swab and that we call with abnormal results.  Pt verbalized understanding with no further questions.  Frances Nickels  11/20/21

## 2021-11-21 ENCOUNTER — Other Ambulatory Visit: Payer: Self-pay | Admitting: Obstetrics & Gynecology

## 2021-11-21 DIAGNOSIS — B3731 Acute candidiasis of vulva and vagina: Secondary | ICD-10-CM

## 2021-11-21 DIAGNOSIS — B9689 Other specified bacterial agents as the cause of diseases classified elsewhere: Secondary | ICD-10-CM

## 2021-11-21 LAB — CERVICOVAGINAL ANCILLARY ONLY
Bacterial Vaginitis (gardnerella): POSITIVE — AB
Candida Glabrata: NEGATIVE
Candida Vaginitis: POSITIVE — AB
Chlamydia: NEGATIVE
Comment: NEGATIVE
Comment: NEGATIVE
Comment: NEGATIVE
Comment: NEGATIVE
Comment: NEGATIVE
Comment: NORMAL
Neisseria Gonorrhea: NEGATIVE
Trichomonas: NEGATIVE

## 2021-11-21 MED ORDER — METRONIDAZOLE 500 MG PO TABS: 500.0000 mg | ORAL_TABLET | Freq: Two times a day (BID) | ORAL | 0 refills | Status: AC

## 2021-11-21 MED ORDER — FLUCONAZOLE 150 MG PO TABS: 150.0000 mg | ORAL_TABLET | Freq: Once | ORAL | 3 refills | Status: AC

## 2022-01-29 ENCOUNTER — Other Ambulatory Visit: Payer: Self-pay

## 2022-01-29 ENCOUNTER — Ambulatory Visit (INDEPENDENT_AMBULATORY_CARE_PROVIDER_SITE_OTHER): Payer: Medicaid Other

## 2022-01-29 ENCOUNTER — Other Ambulatory Visit (HOSPITAL_COMMUNITY)
Admission: RE | Admit: 2022-01-29 | Discharge: 2022-01-29 | Disposition: A | Discharge status: Home / Self Care | Payer: Medicaid Other | Source: Ambulatory Visit | Attending: Family Medicine | Admitting: Family Medicine

## 2022-01-29 ENCOUNTER — Telehealth: Payer: Self-pay

## 2022-01-29 VITALS — BP 118/81 | HR 75 | Ht 62.0 in | Wt 171.2 lb

## 2022-01-29 DIAGNOSIS — Z3202 Encounter for pregnancy test, result negative: Secondary | ICD-10-CM | POA: Diagnosis not present

## 2022-01-29 DIAGNOSIS — N898 Other specified noninflammatory disorders of vagina: Secondary | ICD-10-CM

## 2022-01-29 NOTE — Telephone Encounter (Signed)
Sent patient MyChart message, per patient preference, regarding negative UPT and normal UA results.   Adonis Huguenin RN 01/29/22 at [phone]

## 2022-01-29 NOTE — Progress Notes (Signed)
Pt presents today with itching in vagina, but no odor and clear discharge. No other symptoms per patient.  Patient tested positive for BV and yeast infection in November 2023 and was treated adequately. Self swab done to retest for BV and yeast; patient also requested testing for UTI "just to rule out everything"--will run U/A and send urine culture.   Patient also requests UPT done today "just to be safe"; patient had BTL in Feb 2021. Urine sample left in office; will message patient with results for U/A and UPT. Explained to patient that urine culture and self swab results should be in MyChart in 2-3 days; patient prefers MyChart message for results and next steps.   Reviewed no use of perfumes, vaginal douches, scented soaps or lotions in vaginal area along with wiping front to back when using the restroom. Patient had no questions.   Adonis Huguenin, RN  01/29/22 at 1000

## 2022-01-30 ENCOUNTER — Other Ambulatory Visit: Payer: Self-pay | Admitting: Family Medicine

## 2022-01-30 DIAGNOSIS — B3731 Acute candidiasis of vulva and vagina: Secondary | ICD-10-CM

## 2022-01-30 LAB — POCT PREGNANCY, URINE: Preg Test, Ur: NEGATIVE

## 2022-01-30 MED ORDER — FLUCONAZOLE 150 MG PO TABS: 150.0000 mg | ORAL_TABLET | Freq: Once | ORAL | 1 refills | Status: AC

## 2022-01-31 ENCOUNTER — Encounter: Payer: Self-pay | Admitting: Advanced Practice Midwife

## 2022-01-31 ENCOUNTER — Telehealth: Payer: Self-pay | Admitting: Family Medicine

## 2022-01-31 NOTE — Telephone Encounter (Signed)
Patient reports she came to office for STD testing and was not run as requested by patient. Informed patient of what Cytology advised. Per patient, please cancel nurse visit appointment for tomorrow.   Called Cytology and was informed to fax Add on Testing request to  [phone].   Add on form sent to Cytology at [facility], fax confirmation received.

## 2022-01-31 NOTE — Telephone Encounter (Signed)
Patient called in upset because she came in for a self swab on the 15th where she had requested to do an std check not yeast or bv and the only thing that was tested for was yeast or bv. She is upset that she did not get what she requested and said it would be an inconvenience for her to have to come back in. She wants a nurse to call her.

## 2022-02-01 ENCOUNTER — Ambulatory Visit: Payer: Medicaid Other

## 2022-02-01 LAB — CERVICOVAGINAL ANCILLARY ONLY
Bacterial Vaginitis (gardnerella): NEGATIVE
Candida Glabrata: NEGATIVE
Candida Vaginitis: POSITIVE — AB
Chlamydia: NEGATIVE
Comment: NEGATIVE
Comment: NEGATIVE
Comment: NEGATIVE
Comment: NEGATIVE
Comment: NEGATIVE
Comment: NORMAL
Neisseria Gonorrhea: NEGATIVE
Trichomonas: NEGATIVE

## 2022-02-12 LAB — POCT URINALYSIS DIP (DEVICE)
Bilirubin Urine: NEGATIVE
Glucose, UA: NEGATIVE mg/dL
Ketones, ur: NEGATIVE mg/dL
Leukocytes,Ua: NEGATIVE
Nitrite: NEGATIVE
Protein, ur: NEGATIVE mg/dL
Specific Gravity, Urine: >=1.03 (ref 1.005–1.030)
Urobilinogen, UA: 1 mg/dL (ref 0.0–1.0)
pH: 5.5 (ref 5.0–8.0)

## 2022-05-23 ENCOUNTER — Other Ambulatory Visit (HOSPITAL_COMMUNITY)
Admission: RE | Admit: 2022-05-23 | Discharge: 2022-05-23 | Disposition: A | Discharge status: Home / Self Care | Payer: Medicaid Other | Source: Ambulatory Visit | Attending: Obstetrics and Gynecology | Admitting: Obstetrics and Gynecology

## 2022-05-23 ENCOUNTER — Ambulatory Visit (INDEPENDENT_AMBULATORY_CARE_PROVIDER_SITE_OTHER): Payer: Medicaid Other

## 2022-05-23 ENCOUNTER — Other Ambulatory Visit: Payer: Self-pay

## 2022-05-23 VITALS — BP 103/69 | HR 79 | Ht 62.5 in | Wt 167.0 lb

## 2022-05-23 DIAGNOSIS — N898 Other specified noninflammatory disorders of vagina: Secondary | ICD-10-CM | POA: Insufficient documentation

## 2022-05-23 LAB — POCT URINALYSIS DIP (DEVICE)
Bilirubin Urine: NEGATIVE
Glucose, UA: NEGATIVE mg/dL
Hgb urine dipstick: NEGATIVE
Ketones, ur: NEGATIVE mg/dL
Leukocytes,Ua: NEGATIVE
Nitrite: NEGATIVE
Protein, ur: NEGATIVE mg/dL
Specific Gravity, Urine: >=1.03 (ref 1.005–1.030)
Urobilinogen, UA: 0.2 mg/dL (ref 0.0–1.0)
pH: 6.5 (ref 5.0–8.0)

## 2022-05-23 NOTE — Progress Notes (Signed)
Caitlin Terrell is here with concern of yeast infection and recent intercourse, unprotected, with new sex partner--would like STD/STI testing. Patient also c/o burning with urination. These symptoms have been present for vaginal itching. Patient reports she has tried nothing to resolve symptoms.  Pertinent history: history of yeast infection back in January 2024  Plan of care: Self swab instructions given and specimen obtained. Labs for STD panel (HIV, RPR, Hep C, and Hep B) drawn. Urine sample obtained, and U/A performed--U/A normal, no urine culture obtained d/t U/A results. Explained patient will be contacted with any abnormal results. Patient is not due for annual exam.  Meryl Crutch, RN 05/23/2022  9:49 AM

## 2022-05-24 LAB — RPR: RPR Ser Ql: NONREACTIVE

## 2022-05-24 LAB — CERVICOVAGINAL ANCILLARY ONLY
Bacterial Vaginitis (gardnerella): NEGATIVE
Candida Glabrata: NEGATIVE
Candida Vaginitis: NEGATIVE
Chlamydia: NEGATIVE
Comment: NEGATIVE
Comment: NEGATIVE
Comment: NEGATIVE
Comment: NEGATIVE
Comment: NEGATIVE
Comment: NORMAL
Neisseria Gonorrhea: NEGATIVE
Trichomonas: NEGATIVE

## 2022-05-24 LAB — HIV ANTIBODY (ROUTINE TESTING W REFLEX): HIV Screen 4th Generation wRfx: NONREACTIVE

## 2022-05-24 LAB — HEPATITIS B SURFACE ANTIGEN: Hepatitis B Surface Ag: NEGATIVE

## 2022-05-24 LAB — HEPATITIS C ANTIBODY: Hep C Virus Ab: NONREACTIVE

## 2022-05-28 ENCOUNTER — Ambulatory Visit: Payer: Medicaid Other

## 2022-07-18 ENCOUNTER — Other Ambulatory Visit: Payer: Self-pay

## 2022-07-18 ENCOUNTER — Ambulatory Visit (INDEPENDENT_AMBULATORY_CARE_PROVIDER_SITE_OTHER): Payer: Medicaid Other

## 2022-07-18 ENCOUNTER — Other Ambulatory Visit (HOSPITAL_COMMUNITY)
Admission: RE | Admit: 2022-07-18 | Discharge: 2022-07-18 | Disposition: A | Discharge status: Home / Self Care | Payer: Medicaid Other | Source: Ambulatory Visit | Attending: Family Medicine | Admitting: Family Medicine

## 2022-07-18 VITALS — BP 118/65 | HR 83 | Ht 62.0 in | Wt 166.3 lb

## 2022-07-18 DIAGNOSIS — Z113 Encounter for screening for infections with a predominantly sexual mode of transmission: Secondary | ICD-10-CM | POA: Diagnosis present

## 2022-07-18 NOTE — Progress Notes (Signed)
Pt reports new partner and requests STD screening.  Pt also reports scant white discharge and denies vaginal odor or itching.  Pt explained how to obtain self swab and that we will call with abnormal results.  Pt verbalized understanding.   Leonette Nutting  07/18/22

## 2022-07-20 ENCOUNTER — Encounter: Payer: Self-pay | Admitting: Family Medicine

## 2022-07-20 LAB — CERVICOVAGINAL ANCILLARY ONLY
Bacterial Vaginitis (gardnerella): NEGATIVE
Candida Glabrata: NEGATIVE
Candida Vaginitis: NEGATIVE
Chlamydia: NEGATIVE
Comment: NEGATIVE
Comment: NEGATIVE
Comment: NEGATIVE
Comment: NEGATIVE
Comment: NEGATIVE
Comment: NORMAL
Neisseria Gonorrhea: NEGATIVE
Trichomonas: NEGATIVE

## 2022-08-30 ENCOUNTER — Ambulatory Visit: Payer: Medicaid Other | Admitting: Obstetrics and Gynecology

## 2022-09-11 ENCOUNTER — Encounter: Payer: Self-pay | Admitting: Family Medicine

## 2022-09-11 ENCOUNTER — Ambulatory Visit (INDEPENDENT_AMBULATORY_CARE_PROVIDER_SITE_OTHER): Payer: Medicaid Other

## 2022-09-11 ENCOUNTER — Other Ambulatory Visit: Payer: Self-pay

## 2022-09-11 ENCOUNTER — Other Ambulatory Visit (HOSPITAL_COMMUNITY)
Admission: RE | Admit: 2022-09-11 | Discharge: 2022-09-11 | Disposition: A | Discharge status: Home / Self Care | Payer: Medicaid Other | Source: Ambulatory Visit | Attending: Family Medicine | Admitting: Family Medicine

## 2022-09-11 VITALS — BP 114/80 | HR 79 | Wt 167.3 lb

## 2022-09-11 DIAGNOSIS — N898 Other specified noninflammatory disorders of vagina: Secondary | ICD-10-CM | POA: Diagnosis present

## 2022-09-11 NOTE — Progress Notes (Signed)
Caitlin Terrell is here with concern of increased vaginal discharge with a foul odor. Reports abdominal cramping and vaginal burning (not with urination). This has been present for 1 week. Reports this began at end of menstrual period. Desires STD screening today. Reports she is in monogamous relationship and not using condoms so she likes to be tested every few months. Self-swab instructions given. Explained we will follow up with any abnormal results.  Marjo Bicker, RN 09/11/2022  3:08 PM

## 2022-09-13 ENCOUNTER — Other Ambulatory Visit: Payer: Self-pay | Admitting: Family Medicine

## 2022-09-13 ENCOUNTER — Encounter: Payer: Self-pay | Admitting: Advanced Practice Midwife

## 2022-09-13 DIAGNOSIS — B9689 Other specified bacterial agents as the cause of diseases classified elsewhere: Secondary | ICD-10-CM

## 2022-09-13 LAB — CERVICOVAGINAL ANCILLARY ONLY
Bacterial Vaginitis (gardnerella): POSITIVE — AB
Candida Glabrata: NEGATIVE
Candida Vaginitis: NEGATIVE
Chlamydia: NEGATIVE
Comment: NEGATIVE
Comment: NEGATIVE
Comment: NEGATIVE
Comment: NEGATIVE
Comment: NEGATIVE
Comment: NORMAL
Neisseria Gonorrhea: NEGATIVE
Trichomonas: NEGATIVE

## 2022-09-13 MED ORDER — METRONIDAZOLE 500 MG PO TABS
500.0000 mg | ORAL_TABLET | Freq: Two times a day (BID) | ORAL | 0 refills | Status: DC
Start: 2022-09-13 — End: 2022-12-25

## 2022-09-18 ENCOUNTER — Other Ambulatory Visit: Payer: Self-pay

## 2022-09-18 MED ORDER — FLUCONAZOLE 150 MG PO TABS: 150.0000 mg | ORAL_TABLET | Freq: Once | ORAL | 0 refills | Status: AC

## 2022-09-25 ENCOUNTER — Ambulatory Visit: Payer: Medicaid Other | Admitting: Obstetrics and Gynecology

## 2022-12-25 ENCOUNTER — Other Ambulatory Visit: Payer: Self-pay

## 2022-12-25 ENCOUNTER — Other Ambulatory Visit (HOSPITAL_COMMUNITY)
Admission: RE | Admit: 2022-12-25 | Discharge: 2022-12-25 | Disposition: A | Discharge status: Home / Self Care | Payer: Medicaid Other | Source: Ambulatory Visit | Attending: Family Medicine | Admitting: Family Medicine

## 2022-12-25 ENCOUNTER — Ambulatory Visit: Payer: Medicaid Other

## 2022-12-25 VITALS — BP 109/69 | HR 69 | Ht 62.0 in | Wt 170.6 lb

## 2022-12-25 DIAGNOSIS — Z7251 High risk heterosexual behavior: Secondary | ICD-10-CM | POA: Insufficient documentation

## 2022-12-25 DIAGNOSIS — N898 Other specified noninflammatory disorders of vagina: Secondary | ICD-10-CM

## 2022-12-25 MED ORDER — FLUCONAZOLE 150 MG PO TABS: 150.0000 mg | ORAL_TABLET | Freq: Once | ORAL | 0 refills | Status: AC

## 2022-12-25 MED ORDER — METRONIDAZOLE 500 MG PO TABS: 500.0000 mg | ORAL_TABLET | Freq: Two times a day (BID) | ORAL | 0 refills | Status: DC

## 2022-12-25 NOTE — Progress Notes (Signed)
Caitlin Terrell is here with concern of vaginal odor (musky odor), thin watery discharge, and vaginal itching; unprotected sex recently. These symptoms have been present for one week. Patient reports she has tried nothing to resolve symptoms.  Pertinent history: history of BV and yeast infection in past  Plan of care:   Self swab instructions given and specimen obtained--patient requested full panel STD teting as well, so swab tested for yeast, BV, GCCT, and trich and bloodwork obtained to test for HIV, RPR, Hep C, and Hep B. Explained patient will be contacted with any abnormal results. Patient is due for annual exam; offered for patient to schedule during checkout.  Patient requested for treatment yeast and BV to be sent in prior to results--due to symptoms aligning with protocol, per protocol, flagyl 500mg  BID for 7 days sent in to treat BV and diflucan 150mg  PO one dose sent in to treat yeast.   Meryl Crutch, RN 12/25/2022  3:38 PM

## 2022-12-26 LAB — CERVICOVAGINAL ANCILLARY ONLY
Bacterial Vaginitis (gardnerella): POSITIVE — AB
Candida Glabrata: NEGATIVE
Candida Vaginitis: NEGATIVE
Chlamydia: NEGATIVE
Comment: NEGATIVE
Comment: NEGATIVE
Comment: NEGATIVE
Comment: NEGATIVE
Comment: NEGATIVE
Comment: NORMAL
Neisseria Gonorrhea: NEGATIVE
Trichomonas: NEGATIVE

## 2022-12-26 LAB — HEPATITIS C ANTIBODY: Hep C Virus Ab: NONREACTIVE

## 2022-12-26 LAB — RPR: RPR Ser Ql: NONREACTIVE

## 2022-12-26 LAB — HIV ANTIBODY (ROUTINE TESTING W REFLEX): HIV Screen 4th Generation wRfx: NONREACTIVE

## 2022-12-26 LAB — HEPATITIS B SURFACE ANTIGEN: Hepatitis B Surface Ag: NEGATIVE

## 2022-12-27 ENCOUNTER — Other Ambulatory Visit: Payer: Self-pay | Admitting: Obstetrics and Gynecology

## 2023-01-21 ENCOUNTER — Encounter: Payer: Self-pay | Admitting: Obstetrics and Gynecology

## 2023-01-21 DIAGNOSIS — B379 Candidiasis, unspecified: Secondary | ICD-10-CM

## 2023-01-22 MED ORDER — FLUCONAZOLE 150 MG PO TABS: 150.0000 mg | ORAL_TABLET | Freq: Once | ORAL | 0 refills | Status: AC

## 2023-04-24 ENCOUNTER — Other Ambulatory Visit: Payer: Self-pay | Admitting: Obstetrics and Gynecology

## 2023-04-24 DIAGNOSIS — N898 Other specified noninflammatory disorders of vagina: Secondary | ICD-10-CM
